# Patient Record
Sex: Female | Born: 1950 | Race: White | Hispanic: No | Smoking: Current every day smoker
Health system: Southern US, Community
[De-identification: ages and names within clinical notes are randomized; demographics above are authoritative.]

## PROBLEM LIST (undated history)

## (undated) DIAGNOSIS — Z9289 Personal history of other medical treatment: Secondary | ICD-10-CM

## (undated) DIAGNOSIS — R42 Dizziness and giddiness: Secondary | ICD-10-CM

## (undated) DIAGNOSIS — R0600 Dyspnea, unspecified: Secondary | ICD-10-CM

## (undated) DIAGNOSIS — N390 Urinary tract infection, site not specified: Secondary | ICD-10-CM

## (undated) DIAGNOSIS — R319 Hematuria, unspecified: Secondary | ICD-10-CM

## (undated) DIAGNOSIS — C801 Malignant (primary) neoplasm, unspecified: Secondary | ICD-10-CM

## (undated) DIAGNOSIS — F419 Anxiety disorder, unspecified: Secondary | ICD-10-CM

## (undated) HISTORY — PX: ABDOMINAL HYSTERECTOMY: SHX81

## (undated) HISTORY — PX: OTHER SURGICAL HISTORY: SHX169

---

## 1978-07-31 DIAGNOSIS — Z9289 Personal history of other medical treatment: Secondary | ICD-10-CM

## 1978-07-31 HISTORY — DX: Personal history of other medical treatment: Z92.89

## 1979-08-01 HISTORY — PX: TUBAL LIGATION: SHX77

## 2000-11-13 ENCOUNTER — Encounter: Payer: Self-pay | Admitting: Family Medicine

## 2000-11-13 ENCOUNTER — Encounter: Admission: RE | Admit: 2000-11-13 | Discharge: 2000-11-13 | Payer: Self-pay | Admitting: Family Medicine

## 2000-11-22 ENCOUNTER — Encounter: Payer: Self-pay | Admitting: Family Medicine

## 2000-11-22 ENCOUNTER — Encounter: Admission: RE | Admit: 2000-11-22 | Discharge: 2000-11-22 | Payer: Self-pay | Admitting: Family Medicine

## 2004-07-28 ENCOUNTER — Encounter: Admission: RE | Admit: 2004-07-28 | Discharge: 2004-07-28 | Payer: Self-pay | Admitting: Family Medicine

## 2007-12-11 ENCOUNTER — Other Ambulatory Visit: Admission: RE | Admit: 2007-12-11 | Discharge: 2007-12-11 | Payer: Self-pay | Admitting: Gynecology

## 2007-12-17 ENCOUNTER — Ambulatory Visit: Admission: RE | Admit: 2007-12-17 | Discharge: 2007-12-17 | Payer: Self-pay | Admitting: Gynecology

## 2007-12-24 ENCOUNTER — Encounter: Admission: RE | Admit: 2007-12-24 | Discharge: 2007-12-24 | Payer: Self-pay | Admitting: Gynecology

## 2007-12-25 ENCOUNTER — Ambulatory Visit (HOSPITAL_COMMUNITY): Admission: RE | Admit: 2007-12-25 | Discharge: 2007-12-25 | Payer: Self-pay | Admitting: Gynecology

## 2008-01-02 ENCOUNTER — Encounter (HOSPITAL_COMMUNITY): Admission: RE | Admit: 2008-01-02 | Discharge: 2008-03-13 | Payer: Self-pay | Admitting: Gynecology

## 2017-10-17 ENCOUNTER — Encounter (HOSPITAL_BASED_OUTPATIENT_CLINIC_OR_DEPARTMENT_OTHER): Payer: Self-pay | Admitting: *Deleted

## 2017-10-17 ENCOUNTER — Other Ambulatory Visit: Payer: Self-pay

## 2017-10-17 ENCOUNTER — Other Ambulatory Visit: Payer: Self-pay | Admitting: Urology

## 2017-10-17 NOTE — Progress Notes (Signed)
Spoke with Wiley Npo after midnight food, clear liquids until 800am arrive 1200 pm 10-26-17 [facility]  meds to take cipro Driver daughter Engineer, manufacturing systems over overnight instructions with patient  needs hemaglobin

## 2017-10-26 ENCOUNTER — Encounter (HOSPITAL_BASED_OUTPATIENT_CLINIC_OR_DEPARTMENT_OTHER): Payer: Self-pay | Admitting: *Deleted

## 2017-10-26 ENCOUNTER — Other Ambulatory Visit: Payer: Self-pay

## 2017-10-26 ENCOUNTER — Ambulatory Visit (HOSPITAL_BASED_OUTPATIENT_CLINIC_OR_DEPARTMENT_OTHER): Payer: BLUE CROSS/BLUE SHIELD | Admitting: Certified Registered Nurse Anesthetist

## 2017-10-26 ENCOUNTER — Encounter (HOSPITAL_BASED_OUTPATIENT_CLINIC_OR_DEPARTMENT_OTHER)
Admission: RE | Disposition: A | Discharge status: Home / Self Care | Payer: Self-pay | Source: Ambulatory Visit | Attending: Urology

## 2017-10-26 ENCOUNTER — Observation Stay (HOSPITAL_BASED_OUTPATIENT_CLINIC_OR_DEPARTMENT_OTHER)
Admission: RE | Admit: 2017-10-26 | Discharge: 2017-10-27 | Disposition: A | Discharge status: Home / Self Care | Payer: BLUE CROSS/BLUE SHIELD | Source: Ambulatory Visit | Attending: Urology | Admitting: Urology

## 2017-10-26 DIAGNOSIS — F17210 Nicotine dependence, cigarettes, uncomplicated: Secondary | ICD-10-CM | POA: Insufficient documentation

## 2017-10-26 DIAGNOSIS — Z79899 Other long term (current) drug therapy: Secondary | ICD-10-CM | POA: Insufficient documentation

## 2017-10-26 DIAGNOSIS — C672 Malignant neoplasm of lateral wall of bladder: Secondary | ICD-10-CM | POA: Diagnosis not present

## 2017-10-26 DIAGNOSIS — F419 Anxiety disorder, unspecified: Secondary | ICD-10-CM | POA: Insufficient documentation

## 2017-10-26 DIAGNOSIS — D494 Neoplasm of unspecified behavior of bladder: Secondary | ICD-10-CM | POA: Diagnosis present

## 2017-10-26 DIAGNOSIS — R310 Gross hematuria: Secondary | ICD-10-CM | POA: Diagnosis not present

## 2017-10-26 HISTORY — DX: Malignant (primary) neoplasm, unspecified: C80.1

## 2017-10-26 HISTORY — PX: TRANSURETHRAL RESECTION OF BLADDER TUMOR: SHX2575

## 2017-10-26 HISTORY — DX: Anxiety disorder, unspecified: F41.9

## 2017-10-26 HISTORY — DX: Urinary tract infection, site not specified: N39.0

## 2017-10-26 HISTORY — DX: Hematuria, unspecified: R31.9

## 2017-10-26 HISTORY — DX: Dizziness and giddiness: R42

## 2017-10-26 HISTORY — DX: Dyspnea, unspecified: R06.00

## 2017-10-26 HISTORY — DX: Personal history of other medical treatment: Z92.89

## 2017-10-26 LAB — POCT HEMOGLOBIN-HEMACUE: Hemoglobin: 12.6 g/dL (ref 12.0–15.0)

## 2017-10-26 SURGERY — TURBT (TRANSURETHRAL RESECTION OF BLADDER TUMOR)
Anesthesia: General | Site: Bladder

## 2017-10-26 MED ORDER — HYDROMORPHONE HCL 1 MG/ML IJ SOLN
0.2500 mg | Freq: Once | INTRAMUSCULAR | Status: AC
  Administered 2017-10-26: 0.25 mg via INTRAVENOUS
  Filled 2017-10-26: qty 0.5

## 2017-10-26 MED ORDER — HYDROMORPHONE HCL 1 MG/ML IJ SOLN
0.2500 mg | INTRAMUSCULAR | Status: DC | PRN
  Administered 2017-10-26 (×2): 0.25 mg via INTRAVENOUS
  Administered 2017-10-26: 0.5 mg via INTRAVENOUS
  Filled 2017-10-26: qty 0.5

## 2017-10-26 MED ORDER — FENTANYL CITRATE (PF) 100 MCG/2ML IJ SOLN
INTRAMUSCULAR | Status: DC | PRN
  Administered 2017-10-26 (×4): 25 ug via INTRAVENOUS

## 2017-10-26 MED ORDER — FENTANYL CITRATE (PF) 100 MCG/2ML IJ SOLN
INTRAMUSCULAR | Status: AC
  Filled 2017-10-26: qty 2

## 2017-10-26 MED ORDER — SODIUM CHLORIDE 0.9 % IR SOLN
3000.0000 mL | Status: DC
  Filled 2017-10-26: qty 3000

## 2017-10-26 MED ORDER — ACETAMINOPHEN 325 MG PO TABS
650.0000 mg | ORAL_TABLET | ORAL | Status: DC | PRN
  Filled 2017-10-26: qty 2

## 2017-10-26 MED ORDER — MIDAZOLAM HCL 5 MG/5ML IJ SOLN
INTRAMUSCULAR | Status: DC | PRN
  Administered 2017-10-26: 2 mg via INTRAVENOUS

## 2017-10-26 MED ORDER — HYDROCODONE-ACETAMINOPHEN 5-325 MG PO TABS
ORAL_TABLET | ORAL | Status: AC
  Filled 2017-10-26: qty 1

## 2017-10-26 MED ORDER — MIDAZOLAM HCL 2 MG/2ML IJ SOLN
INTRAMUSCULAR | Status: AC
  Filled 2017-10-26: qty 2

## 2017-10-26 MED ORDER — CEFAZOLIN SODIUM-DEXTROSE 2-4 GM/100ML-% IV SOLN
2.0000 g | Freq: Once | INTRAVENOUS | Status: AC
  Administered 2017-10-26: 2 g via INTRAVENOUS
  Filled 2017-10-26: qty 100

## 2017-10-26 MED ORDER — HYDROCODONE-ACETAMINOPHEN 5-325 MG PO TABS: 1.0000 | ORAL_TABLET | ORAL | 0 refills | Status: DC | PRN

## 2017-10-26 MED ORDER — PROPOFOL 10 MG/ML IV BOLUS
INTRAVENOUS | Status: AC
  Filled 2017-10-26: qty 20

## 2017-10-26 MED ORDER — SODIUM CHLORIDE 0.9 % IR SOLN
Status: DC | PRN
  Administered 2017-10-26: 21000 mL via INTRAVESICAL
  Administered 2017-10-26: 3000 mL via INTRAVESICAL

## 2017-10-26 MED ORDER — LIDOCAINE 2% (20 MG/ML) 5 ML SYRINGE
INTRAMUSCULAR | Status: DC | PRN
  Administered 2017-10-26: 60 mg via INTRAVENOUS

## 2017-10-26 MED ORDER — EPHEDRINE SULFATE-NACL 50-0.9 MG/10ML-% IV SOSY
PREFILLED_SYRINGE | INTRAVENOUS | Status: DC | PRN
  Administered 2017-10-26: 10 mg via INTRAVENOUS

## 2017-10-26 MED ORDER — LIDOCAINE 2% (20 MG/ML) 5 ML SYRINGE
INTRAMUSCULAR | Status: AC
  Filled 2017-10-26: qty 5

## 2017-10-26 MED ORDER — BISACODYL 10 MG RE SUPP
10.0000 mg | Freq: Every day | RECTAL | Status: DC | PRN
  Filled 2017-10-26: qty 1

## 2017-10-26 MED ORDER — HYDROMORPHONE HCL 1 MG/ML IJ SOLN
INTRAMUSCULAR | Status: AC
Start: 2017-10-26 — End: 2017-10-26
  Filled 2017-10-26: qty 1

## 2017-10-26 MED ORDER — DEXAMETHASONE SODIUM PHOSPHATE 10 MG/ML IJ SOLN
INTRAMUSCULAR | Status: AC
  Filled 2017-10-26: qty 1

## 2017-10-26 MED ORDER — ONDANSETRON HCL 4 MG/2ML IJ SOLN
INTRAMUSCULAR | Status: AC
  Filled 2017-10-26: qty 2

## 2017-10-26 MED ORDER — DEXAMETHASONE SODIUM PHOSPHATE 10 MG/ML IJ SOLN
INTRAMUSCULAR | Status: DC | PRN
  Administered 2017-10-26: 10 mg via INTRAVENOUS

## 2017-10-26 MED ORDER — SENNOSIDES-DOCUSATE SODIUM 8.6-50 MG PO TABS
1.0000 | ORAL_TABLET | Freq: Every evening | ORAL | Status: DC | PRN
  Filled 2017-10-26: qty 1

## 2017-10-26 MED ORDER — EPHEDRINE 5 MG/ML INJ
INTRAVENOUS | Status: AC
  Filled 2017-10-26: qty 10

## 2017-10-26 MED ORDER — SODIUM CHLORIDE 0.9 % IV SOLN
INTRAVENOUS | Status: DC
  Filled 2017-10-26: qty 1000

## 2017-10-26 MED ORDER — HYDROMORPHONE HCL 1 MG/ML IJ SOLN
0.2500 mg | INTRAMUSCULAR | Status: DC | PRN
  Filled 2017-10-26: qty 0.5

## 2017-10-26 MED ORDER — CEFAZOLIN SODIUM-DEXTROSE 2-4 GM/100ML-% IV SOLN
INTRAVENOUS | Status: AC
  Filled 2017-10-26: qty 100

## 2017-10-26 MED ORDER — ONDANSETRON HCL 4 MG/2ML IJ SOLN
INTRAMUSCULAR | Status: DC | PRN
  Administered 2017-10-26: 4 mg via INTRAVENOUS

## 2017-10-26 MED ORDER — PHENAZOPYRIDINE HCL 200 MG PO TABS: 200.0000 mg | ORAL_TABLET | Freq: Three times a day (TID) | ORAL | 0 refills | Status: AC | PRN

## 2017-10-26 MED ORDER — DIPHENHYDRAMINE HCL 12.5 MG/5ML PO ELIX
12.5000 mg | ORAL_SOLUTION | Freq: Four times a day (QID) | ORAL | Status: DC | PRN
  Filled 2017-10-26: qty 5

## 2017-10-26 MED ORDER — IOHEXOL 300 MG/ML  SOLN
INTRAMUSCULAR | Status: DC | PRN
  Administered 2017-10-26: 10 mL via URETHRAL

## 2017-10-26 MED ORDER — CEFAZOLIN SODIUM-DEXTROSE 1-4 GM/50ML-% IV SOLN
INTRAVENOUS | Status: AC
  Filled 2017-10-26: qty 50

## 2017-10-26 MED ORDER — BELLADONNA ALKALOIDS-OPIUM 16.2-60 MG RE SUPP
RECTAL | Status: DC | PRN
  Administered 2017-10-26: 1 via RECTAL

## 2017-10-26 MED ORDER — HYDROMORPHONE HCL 1 MG/ML IJ SOLN
INTRAMUSCULAR | Status: AC
  Filled 2017-10-26: qty 1

## 2017-10-26 MED ORDER — MORPHINE SULFATE (PF) 2 MG/ML IV SOLN
2.0000 mg | INTRAVENOUS | Status: DC | PRN
  Filled 2017-10-26: qty 2

## 2017-10-26 MED ORDER — LACTATED RINGERS IV SOLN
INTRAVENOUS | Status: DC
  Administered 2017-10-26 (×2): via INTRAVENOUS
  Filled 2017-10-26: qty 1000

## 2017-10-26 MED ORDER — HYDROCODONE-ACETAMINOPHEN 5-325 MG PO TABS
ORAL_TABLET | ORAL | Status: AC
  Filled 2017-10-26: qty 2

## 2017-10-26 MED ORDER — ONDANSETRON HCL 4 MG/2ML IJ SOLN
4.0000 mg | INTRAMUSCULAR | Status: DC | PRN
  Filled 2017-10-26: qty 2

## 2017-10-26 MED ORDER — BELLADONNA ALKALOIDS-OPIUM 16.2-60 MG RE SUPP
RECTAL | Status: AC
  Filled 2017-10-26: qty 1

## 2017-10-26 MED ORDER — BELLADONNA ALKALOIDS-OPIUM 16.2-60 MG RE SUPP
1.0000 | Freq: Four times a day (QID) | RECTAL | Status: DC | PRN
  Filled 2017-10-26: qty 1

## 2017-10-26 MED ORDER — PROPOFOL 10 MG/ML IV BOLUS
INTRAVENOUS | Status: DC | PRN
  Administered 2017-10-26: 200 mg via INTRAVENOUS

## 2017-10-26 MED ORDER — OXYBUTYNIN CHLORIDE 5 MG PO TABS
5.0000 mg | ORAL_TABLET | Freq: Three times a day (TID) | ORAL | Status: DC | PRN
  Filled 2017-10-26: qty 1

## 2017-10-26 MED ORDER — OXYBUTYNIN CHLORIDE ER 10 MG PO TB24: 10.0000 mg | ORAL_TABLET | Freq: Every day | ORAL | 1 refills | Status: AC

## 2017-10-26 MED ORDER — DIPHENHYDRAMINE HCL 50 MG/ML IJ SOLN
12.5000 mg | Freq: Four times a day (QID) | INTRAMUSCULAR | Status: DC | PRN
  Filled 2017-10-26: qty 0.25

## 2017-10-26 MED ORDER — HYDROCODONE-ACETAMINOPHEN 5-325 MG PO TABS
1.0000 | ORAL_TABLET | ORAL | Status: DC | PRN
  Administered 2017-10-26: 1 via ORAL
  Administered 2017-10-26: 2 via ORAL
  Administered 2017-10-27: 1 via ORAL
  Filled 2017-10-26: qty 2

## 2017-10-26 MED ORDER — ONDANSETRON HCL 4 MG PO TABS: 4.0000 mg | ORAL_TABLET | Freq: Every day | ORAL | 1 refills | Status: AC | PRN

## 2017-10-26 MED ORDER — CEFAZOLIN SODIUM-DEXTROSE 1-4 GM/50ML-% IV SOLN
1.0000 g | Freq: Three times a day (TID) | INTRAVENOUS | Status: DC
  Administered 2017-10-26 – 2017-10-27 (×2): 1 g via INTRAVENOUS
  Filled 2017-10-26: qty 50

## 2017-10-26 SURGICAL SUPPLY — 22 items
BAG DRAIN URO-CYSTO SKYTR STRL (DRAIN) ×4 IMPLANT
BAG DRN UROCATH (DRAIN) ×1
BAG URINE DRAINAGE (UROLOGICAL SUPPLIES) IMPLANT
BAG URINE LEG 500ML (DRAIN) ×2 IMPLANT
CATH HEMA 3WAY 30CC 22FR COUDE (CATHETERS) ×2 IMPLANT
CATH URET 5FR 28IN OPEN ENDED (CATHETERS) ×2 IMPLANT
GLOVE BIO SURGEON STRL SZ7.5 (GLOVE) ×3 IMPLANT
GOWN STRL REUS W/ TWL XL LVL3 (GOWN DISPOSABLE) ×1 IMPLANT
GOWN STRL REUS W/TWL XL LVL3 (GOWN DISPOSABLE) ×3
GUIDEWIRE ANG ZIPWIRE 038X150 (WIRE) ×2 IMPLANT
GUIDEWIRE ZIPWRE .038 STRAIGHT (WIRE) ×2 IMPLANT
HOLDER FOLEY CATH W/STRAP (MISCELLANEOUS) ×2 IMPLANT
IV NS IRRIG 3000ML ARTHROMATIC (IV SOLUTION) ×16 IMPLANT
LOOP CUT BIPOLAR 24F LRG (ELECTROSURGICAL) ×2 IMPLANT
MANIFOLD NEPTUNE II (INSTRUMENTS) ×3 IMPLANT
NS IRRIG 500ML POUR BTL (IV SOLUTION) IMPLANT
PACK CYSTO (CUSTOM PROCEDURE TRAY) ×3 IMPLANT
SYRINGE IRR TOOMEY STRL 70CC (SYRINGE) IMPLANT
TUBE CONNECTING 12'X1/4 (SUCTIONS) ×1
TUBE CONNECTING 12X1/4 (SUCTIONS) ×2 IMPLANT
WATER STERILE IRR 3000ML UROMA (IV SOLUTION) IMPLANT
WATER STERILE IRR 500ML POUR (IV SOLUTION) IMPLANT

## 2017-10-26 NOTE — Op Note (Signed)
Operative Note  Preoperative diagnosis:  1.  5 cm bladder tumor involving the right lateral wall the bladder  Postoperative diagnosis: 1.  5 cm bladder tumor involving the right lateral wall the bladder immediately adjacent to the right ureteral orifice, but not directly involved.  Procedure(s): 1.  Cystoscopy with TURBT of 5 cm bladder tumor 2.  Right retrograde pyelogram  Surgeon: Ellison Hughs, MD  Assistants: None  Anesthesia: General LMA  Complications: None  EBL: 100 mL  Specimens: 1.  Superficial and deep bladder tumor margins  Drains/Catheters: 1.  20 French three-way Foley catheter with 10 mL in the balloon  Intraoperative findings:   1. 5 cm bladder tumor immediately adjacent to the right ureteral orifice, but not directly involved with it. 2. Solitary right collecting system with no filling defects or dilation involving the right ureter or right renal pelvis seen on retrograde pyelogram   Indication:  Alexis Mcclain is a 67 y.o. female with a history of gross hematuria. She had a CT Urogram on 10/12/17, which showed a 5 cm bladder lesion involving the right lateral wall of the bladder, concerning for malignancy.  She had a cystoscopy in the office that revealed a large papillary bladder mass concerning for malignancy.  She is here today for TURBT to address the bladder mass.   Description of procedure:  After informed consent was obtained, the patient was brought to the operating room and general LMA anesthesia was administered. The patient was then placed in the dorsolithotomy position and prepped and draped in usual sterile fashion. A timeout was performed. A 26 French resectoscope was then advanced into the urethral orifice and into the bladder.  A complete bladder survey revealed the known 5 cm papillary bladder tumor seen on office cystoscopy.  The right ureteral orifice was identified immediately bordering the inferior aspects bladder tumor, but not  directly involved.  A 5 French open-ended catheter was then inserted into the right ureteral orifice and a right retrograde pyelogram was obtained, with the findings listed above.  The large bladder tumor was then systematically resected, starting with the superficial tumor.  The superficial tumor was then hand irrigated out of the bladder through the sheath of the cystoscope and sent off as a separate specimen.  The tumor base was then extensively resected down to the detrusor musculature.  The remaining bladder tumor was then irrigated through the sheath of the cystoscope and sent off as a separate specimen.  The tumor resection site was then extensively fulgurated until hemostasis was achieved.  No direct electrocautery or coagulation was used adjacent to the right ureteral orifice,, which was identifiable at the end of the case.  A 20 French three-way Foley catheter was then inserted and started on continuous bladder irrigation.  The patient tolerated the procedure well and was transferred to the postanesthesia unit in stable condition.  Plan: Monitor the patient overnight with continuous bladder irrigation.  Home tomorrow with Foley catheter.  Follow-up in 7-10 days to discuss her pathology results and for a voiding trial.

## 2017-10-26 NOTE — Transfer of Care (Signed)
Immediate Anesthesia Transfer of Care Note  Patient: [name]  Procedure(s) Performed: TRANSURETHRAL RESECTION OF BLADDER TUMOR / CYSTOSCOPY(TURBT) (N/A Bladder)  Patient Location: Immediate Anesthesia Transfer of Care Note  Patient: [name]  Procedure(s) Performed: Procedure(s) (LRB): TRANSURETHRAL RESECTION OF BLADDER TUMOR / CYSTOSCOPY(TURBT) (N/A)  Patient Location: PACU  Anesthesia Type: General  Level of Consciousness: awake, alert  and oriented  Airway & Oxygen Therapy: Patient Spontanous Breathing and Patient connected to face mask oxygen  Post-op Assessment: Report given to PACU RN and Post -op Vital signs reviewed and stable  Post vital signs: Reviewed and stable  Complications: No apparent anesthesia complications Last Vitals:  Vitals Value Taken Time  BP 146/71 10/26/2017  3:45 PM  Temp 36.9 C 10/26/2017  3:40 PM  Pulse 86 10/26/2017  3:47 PM  Resp 13 10/26/2017  3:47 PM  SpO2 99 % 10/26/2017  3:47 PM  Vitals shown include unvalidated device data.  Last Pain:  Vitals:   10/26/17 1235  TempSrc:   PainSc: 2       Patients Stated Pain Goal: 4 (10/26/17 1235)

## 2017-10-26 NOTE — Interval H&P Note (Signed)
History and Physical Interval Note:  10/26/2017 12:44 PM  Alexis Mcclain  has presented today for surgery, with the diagnosis of BLADDER TUMOR  The various methods of treatment have been discussed with the patient and family. After consideration of risks, benefits and other options for treatment, the patient has consented to  Procedure(s) with comments: TRANSURETHRAL RESECTION OF BLADDER TUMOR / CYSTOSCOPY(TURBT) (N/A) - ONLY NEEDS 90 MIN as a surgical intervention .  The patient's history has been reviewed, patient examined, no change in status, stable for surgery.  I have reviewed the patient's chart and labs.  Questions were answered to the patient's satisfaction.     Conception Oms [name]

## 2017-10-26 NOTE — Anesthesia Procedure Notes (Signed)
Procedure Name: LMA Insertion Date/Time: 10/26/2017 2:05 PM Performed by: Genelle Bal, CRNA Pre-anesthesia Checklist: Patient identified, Emergency Drugs available, Suction available and Patient being monitored Patient Re-evaluated:Patient Re-evaluated prior to induction Oxygen Delivery Method: Circle system utilized Preoxygenation: Pre-oxygenation with 100% oxygen Induction Type: IV induction Ventilation: Mask ventilation without difficulty LMA: LMA inserted LMA Size: 4.0 Number of attempts: 1 Airway Equipment and Method: Bite block Placement Confirmation: positive ETCO2 Tube secured with: Tape Dental Injury: Teeth and Oropharynx as per pre-operative assessment

## 2017-10-26 NOTE — Anesthesia Preprocedure Evaluation (Signed)
Anesthesia Evaluation  Patient identified by MRN, date of birth, ID band Patient awake    Reviewed: Allergy & Precautions, NPO status , Patient's Chart, lab work & pertinent test results  Airway Mallampati: II  TM Distance: >3 FB     Dental   Pulmonary shortness of breath, Current Smoker,    breath sounds clear to auscultation       Cardiovascular negative cardio ROS   Rhythm:Regular Rate:Normal     Neuro/Psych    GI/Hepatic negative GI ROS, Neg liver ROS,   Endo/Other  negative endocrine ROS  Renal/GU negative Renal ROS     Musculoskeletal   Abdominal   Peds  Hematology   Anesthesia Other Findings   Reproductive/Obstetrics                             Anesthesia Physical Anesthesia Plan  ASA: II  Anesthesia Plan: General   Post-op Pain Management:    Induction: Intravenous  PONV Risk Score and Plan: 2 and Treatment may vary due to age or medical condition, Ondansetron, Dexamethasone and Midazolam  Airway Management Planned:   Additional Equipment:   Intra-op Plan:   Post-operative Plan: Extubation in OR  Informed Consent: I have reviewed the patients History and Physical, chart, labs and discussed the procedure including the risks, benefits and alternatives for the proposed anesthesia with the patient or authorized representative who has indicated his/her understanding and acceptance.   Dental advisory given  Plan Discussed with: CRNA and Anesthesiologist  Anesthesia Plan Comments:         Anesthesia Quick Evaluation

## 2017-10-26 NOTE — H&P (Signed)
Urology Preoperative H&P   Chief Complaint: bladder mass  History of Present Illness: Alexis Mcclain is a 67 y.o. female with a history of gross hematuria. She had a CT Urogram on 10/12/17, which showed a 5 cm bladder lesion involving the right lateral wall of the bladder, concerning for malignancy.  She had a cystoscopy in the office that revealed a large papillary bladder mass concerning for malignancy.  She is here today for TURBT to address the bladder mass.  CT UROGRAM (10/12/17)  IMPRESSION:  1. 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 No other filling defects are identified along the urothelium. 2. No findings of pathologic adenopathy in the abdomen or pelvis.3. Mild sclerosis of the pubic bones is probably degenerative. Stable right adnexal cystic lesion from 2009, considered benign. 4. Air fluid levels in the distal colon indicating a diarrheal process. 5. Small left adrenal adenoma.    Past Medical History:  Diagnosis Date  . Anxiety   . Cancer ([place])    bladder   . Dizzy    occ last week or so  . Dyspnea    with exertion  . Hematuria last 3 months  . History of blood transfusion 1980   after surgery for ovarian cyst rupture lost pregnancy  . UTI (urinary tract infection)    on ciprofloxacin    Past Surgical History:  Procedure Laterality Date  . ABDOMINAL HYSTERECTOMY  1990's   partial 1 ovary left  . colonscopy  yrs ago  . surgery for ovarian cyst   1990's  . TUBAL LIGATION  1981   laparoscopic    Allergies: No Known Allergies  History reviewed. No pertinent family history.  Social History:  reports that she has been smoking cigarettes.  She has a 63.00 pack-year smoking history. She has never used smokeless tobacco. She reports that she does not drink alcohol or use drugs.  ROS: A complete review of systems was performed.  All systems are negative except for pertinent findings as noted.  Physical Exam:   Vital signs in last 24 hours:   Constitutional:  Alert and oriented, No acute distress Cardiovascular: Regular rate and rhythm, No JVD Respiratory: Normal respiratory effort, Lungs clear bilaterally GI: Abdomen is soft, nontender, nondistended, no abdominal masses GU: No CVA tenderness Lymphatic: No lymphadenopathy Neurologic: Grossly intact, no focal deficits Psychiatric: Normal mood and affect  Laboratory Data:  No results for input(s): WBC, HGB, HCT, PLT in the last 72 hours.  No results for input(s): NA, K, CL, GLUCOSE, BUN, CALCIUM, CREATININE in the last 72 hours.  Invalid input(s): CO3   No results found for this or any previous visit (from the past 24 hour(s)). No results found for this or any previous visit (from the past 240 hour(s)).  Renal Function: No results for input(s): CREATININE in the last 168 hours. CrCl cannot be calculated (No order found.).  Radiologic Imaging: No results found.  I independently reviewed the above imaging studies.  Assessment and Plan Alexis Mcclain is a 67 y.o. female with a 5 cm bladder mass concerning for malignancy  -The risks, benefits and alternatives of cystoscopy with TURBT and possible right ureteral stent placement was discussed with the patient. The risks included, but are not limited to: bleeding, urinary tract infection, bladder perforation requiring prolonged catheterization and/or open bladder repair, ureteral obstruction, stent related discomfort, voiding dysfunction and the inherent risks of general anesthesia. The patient voices understanding and wishes to  proceed.   Ellison Hughs, MD 10/26/2017, 11:21 AM  [facility] Pager: [phone]

## 2017-10-27 DIAGNOSIS — C672 Malignant neoplasm of lateral wall of bladder: Secondary | ICD-10-CM | POA: Diagnosis not present

## 2017-10-27 LAB — HEMOGLOBIN AND HEMATOCRIT, BLOOD
HEMATOCRIT: 30 % — AB (ref 36.0–46.0)
Hemoglobin: 9.6 g/dL — ABNORMAL LOW (ref 12.0–15.0)

## 2017-10-27 LAB — HIV ANTIBODY (ROUTINE TESTING W REFLEX): HIV SCREEN 4TH GENERATION: NONREACTIVE

## 2017-10-27 MED ORDER — CEFAZOLIN SODIUM-DEXTROSE 1-4 GM/50ML-% IV SOLN
INTRAVENOUS | Status: AC
  Filled 2017-10-27: qty 50

## 2017-10-27 MED ORDER — HYDROCODONE-ACETAMINOPHEN 5-325 MG PO TABS
ORAL_TABLET | ORAL | Status: AC
  Filled 2017-10-27: qty 1

## 2017-10-27 NOTE — Discharge Summary (Signed)
Physician Discharge Summary  Patient ID: Alexis Mcclain MRN: [id number] DOB/AGE: 65-Jan-1952 65 y.o.  Admit date: 10/26/2017 Discharge date: 10/27/2017  Admission Diagnoses: Bladder tumor  Discharge Diagnoses:  Active Problems:   Bladder tumor   Discharged Condition: good  Hospital Course: She was noted to have a large bladder tumor and was admitted for elective transurethral resection.  This proceeded without difficulty.  She was observed overnight on continuous bladder irrigation with her urine having remained completely clear all night long.  She is not having any pain.  She is tolerating regular diet.  She is felt ready for discharge at this time.   Discharge Exam: Blood pressure (!) 104/55, pulse (!) 56, temperature 98.6 F (37 C), temperature source Oral, resp. rate 16, height 5\' 2"  (1.575 m), weight 59.6 kg (131 lb 4.8 oz), SpO2 95 %. Awake, alert and in no distress. Normal respiratory effort and rate. Cardiovascular rate rate and rhythm Abdomen soft and nontender Foley catheter draining completely clear urine.  Disposition: Discharge disposition: 01-Home or Self Care       Discharge Instructions    Discharge patient   Complete by:  As directed    Discharge disposition:  01-Home or Self Care   Discharge patient date:  10/27/2017      thank you Follow-up Information    Ceasar Mons, MD In 1 week.   Specialty:  Urology Why:  catheter remvoal and to discuss pathology results Contact information: [address] [place] [address] [phone]           Signed: Claybon Jabs 10/27/2017, 7:58 AM

## 2017-10-29 ENCOUNTER — Encounter (HOSPITAL_BASED_OUTPATIENT_CLINIC_OR_DEPARTMENT_OTHER): Payer: Self-pay | Admitting: Urology

## 2017-10-30 NOTE — Anesthesia Postprocedure Evaluation (Signed)
Anesthesia Post Note  Patient: Alexis Mcclain  Procedure(s) Performed: TRANSURETHRAL RESECTION OF BLADDER TUMOR / CYSTOSCOPY(TURBT) (N/A Bladder)     Patient location during evaluation: PACU Anesthesia Type: General Level of consciousness: awake Pain management: pain level controlled Vital Signs Assessment: post-procedure vital signs reviewed and stable Respiratory status: spontaneous breathing Cardiovascular status: stable Anesthetic complications: no    Last Vitals:  Vitals:   10/27/17 0300 10/27/17 0700  BP: 118/60 (!) 104/55  Pulse: 62 (!) 56  Resp: 16 16  Temp: 37 C 37 C  SpO2: 96% 95%    Last Pain:  Vitals:   10/29/17 1019  TempSrc:   PainSc: 2                  Kaheem Halleck

## 2017-11-02 ENCOUNTER — Ambulatory Visit (HOSPITAL_COMMUNITY)
Admission: RE | Admit: 2017-11-02 | Discharge: 2017-11-02 | Disposition: A | Discharge status: Home / Self Care | Payer: BLUE CROSS/BLUE SHIELD | Source: Ambulatory Visit | Attending: Urology | Admitting: Urology

## 2017-11-02 ENCOUNTER — Other Ambulatory Visit (HOSPITAL_COMMUNITY): Payer: Self-pay | Admitting: Urology

## 2017-11-02 ENCOUNTER — Telehealth: Payer: Self-pay | Admitting: Oncology

## 2017-11-02 DIAGNOSIS — C672 Malignant neoplasm of lateral wall of bladder: Secondary | ICD-10-CM | POA: Diagnosis not present

## 2017-11-02 NOTE — Telephone Encounter (Signed)
Called referring office and patient with date/time/location/phone # for appt

## 2017-11-14 ENCOUNTER — Encounter: Payer: Self-pay | Admitting: *Deleted

## 2017-11-14 NOTE — Progress Notes (Signed)
Alexis Mcclain  Clinical Social Mcclain received phone call from patient's daughter requesting information on applying for disability and possible insurance options.  CSW discussed social security disability process and made referral to [facility] to assist in application.  Patient interested in applying for Medicaid if she loses insurance through her employer.    [name], MSW, LCSW, OSW-C Clinical Social Worker [facility] [phone]

## 2017-11-21 ENCOUNTER — Inpatient Hospital Stay: Payer: BLUE CROSS/BLUE SHIELD | Attending: Oncology | Admitting: Oncology

## 2017-11-21 ENCOUNTER — Encounter: Payer: Self-pay | Admitting: Oncology

## 2017-11-21 ENCOUNTER — Inpatient Hospital Stay: Payer: BLUE CROSS/BLUE SHIELD

## 2017-11-21 VITALS — BP 151/72 | HR 90 | Temp 97.9°F | Resp 17 | Ht 62.0 in | Wt 132.7 lb

## 2017-11-21 DIAGNOSIS — C679 Malignant neoplasm of bladder, unspecified: Secondary | ICD-10-CM | POA: Diagnosis not present

## 2017-11-21 DIAGNOSIS — C7989 Secondary malignant neoplasm of other specified sites: Secondary | ICD-10-CM

## 2017-11-21 DIAGNOSIS — D494 Neoplasm of unspecified behavior of bladder: Secondary | ICD-10-CM

## 2017-11-21 DIAGNOSIS — R319 Hematuria, unspecified: Secondary | ICD-10-CM | POA: Diagnosis not present

## 2017-11-21 DIAGNOSIS — D649 Anemia, unspecified: Secondary | ICD-10-CM | POA: Diagnosis not present

## 2017-11-21 DIAGNOSIS — F17210 Nicotine dependence, cigarettes, uncomplicated: Secondary | ICD-10-CM

## 2017-11-21 MED ORDER — LIDOCAINE-PRILOCAINE 2.5-2.5 % EX CREA: 1.0000 "application " | TOPICAL_CREAM | CUTANEOUS | 0 refills | Status: DC | PRN

## 2017-11-21 MED ORDER — PROCHLORPERAZINE MALEATE 10 MG PO TABS: 10.0000 mg | ORAL_TABLET | Freq: Four times a day (QID) | ORAL | 0 refills | Status: DC | PRN

## 2017-11-21 NOTE — Progress Notes (Signed)
Reason for Referral: Bladder cancer  HPI: 67 year old Alexis Mcclain currently of [place] diagnosed with bladder cancer in March 2019.  She presented with symptoms of hematuria and was evaluated by Dr. Lovena Neighbours at [facility].  She had a CT urogram on 10/12/2017 which showed a 5 cm bladder mass involving the right lateral wall concerning for malignancy.  No lymphadenopathy noted on that imaging studies.  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  She was referred to me for the evaluation for neoadjuvant chemotherapy prior to potential cystectomy.  Her baseline kidney function is normal.  Since her TURBT she has been reasonably asymptomatic.  She denies any hematuria or dysuria.  She denies any flank pain or discomfort.  She does report some mild fatigue and occasional dyspnea on exertion.  She continues to smoke currently about 1 pack a day but has smoked heavily for many years close to 2 pack a day.  She does not report any headaches, blurry vision, syncope or seizures. Does not report any fevers, chills or sweats.  Does not report any cough, wheezing or hemoptysis.  Does not report any chest pain, palpitation, orthopnea or leg edema.  Does not report any nausea, vomiting or abdominal pain.  Does not report any constipation or diarrhea.  Does not report any skeletal complaints.    Does not report frequency, urgency or hematuria.  Does not report any skin rashes or lesions. Does not report any heat or cold intolerance.  Does not report any lymphadenopathy or petechiae.  Does not report any anxiety or depression.  Remaining review of systems is negative.    Past Medical History:  Diagnosis Date  . Anxiety   . Cancer ([place])    bladder   . Dizzy    occ last week or so  . Dyspnea    with exertion  . Hematuria  last 3 months  . History of blood transfusion 1980   after surgery for ovarian cyst rupture lost pregnancy  . UTI (urinary tract infection)    on ciprofloxacin  :  Past Surgical History:  Procedure Laterality Date  . ABDOMINAL HYSTERECTOMY  1990's   partial 1 ovary left  . colonscopy  yrs ago  . surgery for ovarian cyst   1990's  . TRANSURETHRAL RESECTION OF BLADDER TUMOR N/A 10/26/2017   Procedure: TRANSURETHRAL RESECTION OF BLADDER TUMOR / CYSTOSCOPY(TURBT);  Surgeon: Ceasar Mons, MD;  Location: [facility];  Service: Urology;  Laterality: N/A;  ONLY NEEDS 90 MIN  . TUBAL LIGATION  1981   laparoscopic  :   Current Outpatient Medications:  .  HYDROcodone-acetaminophen (NORCO) 5-325 MG tablet, Take 1 tablet by mouth every 4 (four) hours as needed for moderate pain., Disp: 20 tablet, Rfl: 0 .  ondansetron (ZOFRAN) 4 MG tablet, Take 1 tablet (4 mg total) by mouth daily as needed for nausea or vomiting., Disp: 30 tablet, Rfl: 1 .  oxybutynin (DITROPAN XL) 10 MG 24 hr tablet, Take 1 tablet (10 mg total) by mouth daily., Disp: 30 tablet, Rfl: 1 .  phenazopyridine (PYRIDIUM) 200 MG tablet, Take 1 tablet (200 mg total) by mouth 3 (three) times daily as needed for pain., Disp: 30 tablet, Rfl: 0:  Allergies  Allergen Reactions  . Tape Other (See Comments)    Burns skin    Hurt when pulled off  :  No  family history on file.:  Social History   Socioeconomic History  . Marital status: Divorced    Spouse name: Not on file  . Number of children: Not on file  . Years of education: Not on file  . Highest education level: Not on file  Occupational History  . Not on file  Social Needs  . Financial resource strain: Not on file  . Food insecurity:    Worry: Not on file    Inability: Not on file  . Transportation needs:    Medical: Not on file    Non-medical: Not on file  Tobacco Use  . Smoking status: Current Every Day Smoker    Packs/day: 1.50    Years:  42.00    Pack years: 63.00    Types: Cigarettes  . Smokeless tobacco: Never Used  Substance and Sexual Activity  . Alcohol use: No    Frequency: Never  . Drug use: No  . Sexual activity: Not on file  Lifestyle  . Physical activity:    Days per week: Not on file    Minutes per session: Not on file  . Stress: Not on file  Relationships  . Social connections:    Talks on phone: Not on file    Gets together: Not on file    Attends religious service: Not on file    Active member of club or organization: Not on file    Attends meetings of clubs or organizations: Not on file    Relationship status: Not on file  . Intimate partner violence:    Fear of current or ex partner: Not on file    Emotionally abused: Not on file    Physically abused: Not on file    Forced sexual activity: Not on file  Other Topics Concern  . Not on file  Social History Narrative  . Not on file  :    Exam: Blood pressure (!) 151/72, pulse 90, temperature 97.9 F (36.6 C), temperature source Oral, resp. rate 17, height 5\' 2"  (1.575 m), weight 132 lb 11.2 oz (60.2 kg), SpO2 98 %.   ECOG 1 General appearance: alert and cooperative appeared without distress. Head: atraumatic without any abnormalities. Eyes: conjunctivae/corneas clear. PERRL.  Sclera anicteric. Throat: lips, mucosa, and tongue normal; without oral thrush or ulcers. Resp: clear to auscultation bilaterally without rhonchi, wheezes or dullness to percussion. Cardio: regular rate and rhythm, S1, S2 normal, no murmur, click, rub or gallop GI: soft, non-tender; bowel sounds normal; no masses,  no organomegaly Skin: Skin color, texture, turgor normal. No rashes or lesions Lymph nodes: Cervical, supraclavicular, and axillary nodes normal. Neurologic: Grossly normal without any motor, sensory or deep tendon reflexes. Musculoskeletal: No joint deformity or effusion.  CBC    Component Value Date/Time   HGB 9.6 (L) 10/27/2017 0548   HCT 30.0  (L) 10/27/2017 0548     Dg Chest 2 View  Result Date: 11/02/2017 CLINICAL DATA:  Malignant neoplasm bladder wall urinary bladder. EXAM: CHEST - 2 VIEW COMPARISON:  12/17/2007 FINDINGS: Pulmonary hyperinflation. Lungs are clear without infiltrate effusion or mass. No interval change. IMPRESSION: No active cardiopulmonary disease. Electronically Signed   By: Franchot Gallo M.D.   On: 11/02/2017 16:58    Assessment and Plan:   67 year old Alexis Mcclain with the following issues:  1.  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  Her tumor invades into the muscularis propria indicating potentially T3 disease.  Her abdominal  imaging studies did not show any lymphadenopathy.  She presented initially with hematuria.  The natural course of this disease was discussed today and treatment options were reviewed.  These options would include radical cystectomy alone followed by adjuvant chemotherapy, neoadjuvant chemotherapy followed by radical cystectomy versus definitive treatment with radiation concomitantly with chemotherapy.  After discussion today, she is agreeable to proceed with neoadjuvant chemotherapy after obtaining a PET scan for staging purposes.  Complication associated with chemotherapy include nausea, vomiting, myelosuppression, neutropenia, neutropenic sepsis, renal dysfunction as well as infusion related complications.  The plan is tentatively to proceed with 4 cycles of chemotherapy utilizing cisplatin and gemcitabine given on day 1, gemcitabine on day 8 out of 21-day cycle.  2.  Renal function surveillance: Her creatinine at baseline is normal without any evidence of hydronephrosis.  3.  Anemia: Her hemoglobin is 9.6 likely related to hematuria.  We will continue to monitor her hemoglobin and transfuse as needed.  4.  IV access: Risks and benefits of the Port-A-Cath insertion was reviewed today and she is agreeable to proceed.   Complication associated with this procedure include bleeding, thrombosis and infection.  EMLA cream was also prescribed at this time.  5.  Antiemetics: Prescription for Compazine was made available to her.  6.  Dyspnea on exertion: I recommend a cardiology evaluation prior to future surgery potentially.  7.  Prognosis: Her disease remains incurable as long as she does not have metastatic disease detected on PET scan.  However, she does have an aggressive histology and aggressive treatment is warranted to ensure disease stability.  8.  Follow-up: Will be in the next 2 weeks to start chemotherapy tentatively.    60  minutes was spent with the patient face-to-face today.  More than 50% of time was dedicated to patient counseling, education and coordination of her multifaceted care.

## 2017-11-21 NOTE — Progress Notes (Signed)
START ON PATHWAY REGIMEN - Bladder     A cycle is every 21 days:     Gemcitabine      Cisplatin   **Always confirm dose/schedule in your pharmacy ordering system**    Patient Characteristics: Pre Cystectomy, Clinical T2-T4a, N0-1, M0, Cystectomy Eligible, Cisplatin-Based Chemotherapy Indicated (CrCl ? 50 mL/min and Minimal or No Symptoms) AJCC M Category: M0 AJCC N Category: N0 AJCC T Category: T3 Current evidence of distant metastases<= No AJCC 8 Stage Grouping: IIIA Intent of Therapy: Curative Intent, Discussed with Patient 

## 2017-11-21 NOTE — Addendum Note (Signed)
Addended by: Wyatt Portela on: 11/21/2017 02:24 PM   Modules accepted: Orders

## 2017-11-21 NOTE — Progress Notes (Signed)
Met with patient and daughters to discuss financial assistance.  Advised there are currently no foundations available for her diagnosis, but should anything come available, I will reach out to her. Provided a flyer on [employer], should she become interested in the future where she can combine accounts and make one monthly payment.  Proof of income was provided. Patient approved for one-time $400 [employer]. Gave patient a copy of approval letter as well as expense sheet along with the outpatient pharmacy information.  She has my card for any additional financial questions or concerns.

## 2017-11-23 ENCOUNTER — Other Ambulatory Visit: Payer: BLUE CROSS/BLUE SHIELD

## 2017-11-28 ENCOUNTER — Telehealth: Payer: Self-pay | Admitting: *Deleted

## 2017-11-28 ENCOUNTER — Encounter (HOSPITAL_COMMUNITY)
Admission: RE | Admit: 2017-11-28 | Discharge: 2017-11-28 | Disposition: A | Discharge status: Home / Self Care | Payer: BLUE CROSS/BLUE SHIELD | Source: Ambulatory Visit | Attending: Oncology | Admitting: Oncology

## 2017-11-28 DIAGNOSIS — D494 Neoplasm of unspecified behavior of bladder: Secondary | ICD-10-CM

## 2017-11-28 LAB — GLUCOSE, CAPILLARY: Glucose-Capillary: 106 mg/dL — ABNORMAL HIGH (ref 65–99)

## 2017-11-28 MED ORDER — FLUDEOXYGLUCOSE F - 18 (FDG) INJECTION
7.3300 | Freq: Once | INTRAVENOUS | Status: AC | PRN
  Administered 2017-11-28: 7.33 via INTRAVENOUS

## 2017-11-28 NOTE — Telephone Encounter (Signed)
As noted below by Dr. Alen Blew, I informed patient that her scan showed no cancer outside the bladder. She verbalized understanding.

## 2017-11-28 NOTE — Telephone Encounter (Signed)
-----   Message from Alexis Portela, MD sent at 11/28/2017 11:56 AM EDT ----- Please let her know her PET scan showed no cancer outside the bladder.

## 2017-11-29 ENCOUNTER — Telehealth: Payer: Self-pay | Admitting: Oncology

## 2017-11-29 NOTE — Telephone Encounter (Signed)
FAXED RECORDS TO [facility]  RELEASE ID [id number]

## 2017-12-06 ENCOUNTER — Other Ambulatory Visit: Payer: Self-pay | Admitting: Radiology

## 2017-12-10 ENCOUNTER — Ambulatory Visit (HOSPITAL_COMMUNITY)
Admission: RE | Admit: 2017-12-10 | Discharge: 2017-12-10 | Disposition: A | Discharge status: Home / Self Care | Payer: BLUE CROSS/BLUE SHIELD | Source: Ambulatory Visit | Attending: Oncology | Admitting: Oncology

## 2017-12-10 ENCOUNTER — Other Ambulatory Visit: Payer: Self-pay | Admitting: Oncology

## 2017-12-10 ENCOUNTER — Encounter (HOSPITAL_COMMUNITY): Payer: Self-pay | Admitting: Interventional Radiology

## 2017-12-10 DIAGNOSIS — F17210 Nicotine dependence, cigarettes, uncomplicated: Secondary | ICD-10-CM | POA: Diagnosis not present

## 2017-12-10 DIAGNOSIS — Z90711 Acquired absence of uterus with remaining cervical stump: Secondary | ICD-10-CM | POA: Diagnosis not present

## 2017-12-10 DIAGNOSIS — Z9889 Other specified postprocedural states: Secondary | ICD-10-CM | POA: Insufficient documentation

## 2017-12-10 DIAGNOSIS — Z87440 Personal history of urinary (tract) infections: Secondary | ICD-10-CM | POA: Diagnosis not present

## 2017-12-10 DIAGNOSIS — C679 Malignant neoplasm of bladder, unspecified: Secondary | ICD-10-CM | POA: Diagnosis not present

## 2017-12-10 DIAGNOSIS — D494 Neoplasm of unspecified behavior of bladder: Secondary | ICD-10-CM

## 2017-12-10 DIAGNOSIS — Z888 Allergy status to other drugs, medicaments and biological substances status: Secondary | ICD-10-CM | POA: Diagnosis not present

## 2017-12-10 HISTORY — PX: IR US GUIDE VASC ACCESS RIGHT: IMG2390

## 2017-12-10 HISTORY — PX: IR FLUORO GUIDE PORT INSERTION RIGHT: IMG5741

## 2017-12-10 LAB — CBC WITH DIFFERENTIAL/PLATELET
BASOS ABS: 0 10*3/uL (ref 0.0–0.1)
Basophils Relative: 0 %
Eosinophils Absolute: 0.3 10*3/uL (ref 0.0–0.7)
Eosinophils Relative: 3 %
HEMATOCRIT: 40.1 % (ref 36.0–46.0)
HEMOGLOBIN: 12.6 g/dL (ref 12.0–15.0)
Lymphocytes Relative: 37 %
Lymphs Abs: 3.1 10*3/uL (ref 0.7–4.0)
MCH: 27.5 pg (ref 26.0–34.0)
MCHC: 31.4 g/dL (ref 30.0–36.0)
MCV: 87.4 fL (ref 78.0–100.0)
MONO ABS: 0.8 10*3/uL (ref 0.1–1.0)
MONOS PCT: 10 %
NEUTROS ABS: 4.1 10*3/uL (ref 1.7–7.7)
Neutrophils Relative %: 50 %
Platelets: 463 10*3/uL — ABNORMAL HIGH (ref 150–400)
RBC: 4.59 MIL/uL (ref 3.87–5.11)
RDW: 14.2 % (ref 11.5–15.5)
WBC: 8.2 10*3/uL (ref 4.0–10.5)

## 2017-12-10 LAB — BASIC METABOLIC PANEL
ANION GAP: 12 (ref 5–15)
BUN: 11 mg/dL (ref 6–20)
CO2: 26 mmol/L (ref 22–32)
Calcium: 10 mg/dL (ref 8.9–10.3)
Chloride: 105 mmol/L (ref 101–111)
Creatinine, Ser: 0.76 mg/dL (ref 0.44–1.00)
GFR calc Af Amer: >60 mL/min (ref >60)
GFR calc non Af Amer: >60 mL/min (ref >60)
GLUCOSE: 101 mg/dL — AB (ref 65–99)
POTASSIUM: 3.8 mmol/L (ref 3.5–5.1)
Sodium: 143 mmol/L (ref 135–145)

## 2017-12-10 LAB — PROTIME-INR
INR: 0.87
Prothrombin Time: 11.7 seconds (ref 11.4–15.2)

## 2017-12-10 MED ORDER — FENTANYL CITRATE (PF) 100 MCG/2ML IJ SOLN
INTRAMUSCULAR | Status: AC | PRN
  Administered 2017-12-10: 50 ug via INTRAVENOUS
  Administered 2017-12-10 (×2): 25 ug via INTRAVENOUS

## 2017-12-10 MED ORDER — HEPARIN SOD (PORK) LOCK FLUSH 100 UNIT/ML IV SOLN
INTRAVENOUS | Status: AC
  Filled 2017-12-10: qty 5

## 2017-12-10 MED ORDER — FENTANYL CITRATE (PF) 100 MCG/2ML IJ SOLN
INTRAMUSCULAR | Status: AC
  Filled 2017-12-10: qty 4

## 2017-12-10 MED ORDER — LIDOCAINE HCL 1 % IJ SOLN
INTRAMUSCULAR | Status: AC
  Filled 2017-12-10: qty 20

## 2017-12-10 MED ORDER — CEFAZOLIN SODIUM-DEXTROSE 2-4 GM/100ML-% IV SOLN
2.0000 g | INTRAVENOUS | Status: AC
  Administered 2017-12-10: 2 g via INTRAVENOUS

## 2017-12-10 MED ORDER — SODIUM CHLORIDE 0.9 % IV SOLN
INTRAVENOUS | Status: DC
  Administered 2017-12-10: 08:00:00 via INTRAVENOUS

## 2017-12-10 MED ORDER — MIDAZOLAM HCL 2 MG/2ML IJ SOLN
INTRAMUSCULAR | Status: AC | PRN
  Administered 2017-12-10 (×2): 0.5 mg via INTRAVENOUS
  Administered 2017-12-10: 1 mg via INTRAVENOUS

## 2017-12-10 MED ORDER — MIDAZOLAM HCL 2 MG/2ML IJ SOLN
INTRAMUSCULAR | Status: AC
  Filled 2017-12-10: qty 4

## 2017-12-10 MED ORDER — LIDOCAINE-EPINEPHRINE (PF) 2 %-1:200000 IJ SOLN
INTRAMUSCULAR | Status: AC
  Filled 2017-12-10: qty 20

## 2017-12-10 MED ORDER — CEFAZOLIN SODIUM-DEXTROSE 2-4 GM/100ML-% IV SOLN
INTRAVENOUS | Status: AC
  Administered 2017-12-10: 2 g via INTRAVENOUS
  Filled 2017-12-10: qty 100

## 2017-12-10 MED ORDER — HEPARIN SOD (PORK) LOCK FLUSH 100 UNIT/ML IV SOLN
INTRAVENOUS | Status: AC | PRN
  Administered 2017-12-10: 500 [IU] via INTRAVENOUS

## 2017-12-10 NOTE — Discharge Instructions (Signed)
Moderate Conscious Sedation, Adult, Care After °These instructions provide you with information about caring for yourself after your procedure. Your health care provider may also give you more specific instructions. Your treatment has been planned according to current medical practices, but problems sometimes occur. Call your health care provider if you have any problems or questions after your procedure. °What can I expect after the procedure? °After your procedure, it is common: °· To feel sleepy for several hours. °· To feel clumsy and have poor balance for several hours. °· To have poor judgment for several hours. °· To vomit if you eat too soon. ° °Follow these instructions at home: °For at least 24 hours after the procedure: ° °· Do not: °? Participate in activities where you could fall or become injured. °? Drive. °? Use heavy machinery. °? Drink alcohol. °? Take sleeping pills or medicines that cause drowsiness. °? Make important decisions or sign legal documents. °? Take care of children on your own. °· Rest. °Eating and drinking °· Follow the diet recommended by your health care provider. °· If you vomit: °? Drink water, juice, or soup when you can drink without vomiting. °? Make sure you have little or no nausea before eating solid foods. °General instructions °· Have a responsible adult stay with you until you are awake and alert. °· Take over-the-counter and prescription medicines only as told by your health care provider. °· If you smoke, do not smoke without supervision. °· Keep all follow-up visits as told by your health care provider. This is important. °Contact a health care provider if: °· You keep feeling nauseous or you keep vomiting. °· You feel light-headed. °· You develop a rash. °· You have a fever. °Get help right away if: °· You have trouble breathing. °This information is not intended to replace advice given to you by your health care provider. Make sure you discuss any questions you have  with your health care provider. °Document Released: 05/07/2013 Document Revised: 12/20/2015 Document Reviewed: 11/06/2015 °Elsevier Interactive Patient Education © 2018 [employer] ° ° °Implanted Port Insertion, Care After °This sheet gives you information about how to care for yourself after your procedure. Your health care provider may also give you more specific instructions. If you have problems or questions, contact your health care provider. °What can I expect after the procedure? °After your procedure, it is common to have: °· Discomfort at the port insertion site. °· Bruising on the skin over the port. This should improve over 3-4 days. ° °Follow these instructions at home: °Port care °· After your port is placed, you will get a manufacturer's information card. The card has information about your port. Keep this card with you at all times. °· Take care of the port as told by your health care provider. Ask your health care provider if you or a family member can get training for taking care of the port at home. A home health care nurse may also take care of the port. °· Make sure to remember what type of port you have. °Incision care °· Follow instructions from your health care provider about how to take care of your port insertion site. Make sure you: °? Wash your hands with soap and water before you change your bandage (dressing). If soap and water are not available, use hand sanitizer. °? Change your dressing as told by your health care provider.  You may remove your dressing tomorrow. °? Leave skin glue in place. These skin closures   may need to stay in place for 2 weeks or longer. If adhesive strip edges start to loosen and curl up, you may trim the loose edges. Do not remove adhesive strips completely unless your health care provider tells you to do that.  DO NOT use EMLA cream for 2 weeks after port placement as this cream will remove the surgical glue on your incision. °· Check your port insertion  site every day for signs of infection. Check for: °? More redness, swelling, or pain. °? More fluid or blood. °? Warmth. °? Pus or a bad smell. °General instructions °· Do not take baths, swim, or use a hot tub until your health care provider approves.  You may shower tomorrow. °· Do not lift anything that is heavier than 10 lb (4.5 kg) for a week, or as told by your health care provider. °· Ask your health care provider when it is okay to: °? Return to work or school. °? Resume usual physical activities or sports. °· Do not drive for 24 hours if you were given a medicine to help you relax (sedative). °· Take over-the-counter and prescription medicines only as told by your health care provider. °· Wear a medical alert bracelet in case of an emergency. This will tell any health care providers that you have a port. °· Keep all follow-up visits as told by your health care provider. This is important. °Contact a health care provider if: °· You have a fever or chills. °· You have more redness, swelling, or pain around your port insertion site. °· You have more fluid or blood coming from your port insertion site. °· Your port insertion site feels warm to the touch. °· You have pus or a bad smell coming from the port insertion site. °Get help right away if: °· You have chest pain or shortness of breath. °· You have bleeding from your port that you cannot control. °Summary °· Take care of the port as told by your health care provider. °· Change your dressing as told by your health care provider. °· Keep all follow-up visits as told by your health care provider. °This information is not intended to replace advice given to you by your health care provider. Make sure you discuss any questions you have with your health care provider. °Document Released: 05/07/2013 Document Revised: 06/07/2016 Document Reviewed: 06/07/2016 °Elsevier Interactive Patient Education © 2017 Else[employer] Inc. ° °

## 2017-12-10 NOTE — Consult Note (Signed)
Chief Complaint: Patient was seen in consultation today for Port-A-Cath placement  Referring Physician(s): Wyatt Portela  Supervising Physician: Daryll Brod  Patient Status: [place]  History of Present Illness: Alexis Mcclain is a 67 y.o. female smoker with history of bladder carcinoma diagnosed in March of this year, status post TURBT, who presents today for Port-A-Cath placement for chemotherapy.  Past Medical History:  Diagnosis Date  . Anxiety   . Cancer ([place])    bladder   . Dizzy    occ last week or so  . Dyspnea    with exertion  . Hematuria last 3 months  . History of blood transfusion 1980   after surgery for ovarian cyst rupture lost pregnancy  . UTI (urinary tract infection)    on ciprofloxacin    Past Surgical History:  Procedure Laterality Date  . ABDOMINAL HYSTERECTOMY  1990's   partial 1 ovary left  . colonscopy  yrs ago  . surgery for ovarian cyst   1990's  . TRANSURETHRAL RESECTION OF BLADDER TUMOR N/A 10/26/2017   Procedure: TRANSURETHRAL RESECTION OF BLADDER TUMOR / CYSTOSCOPY(TURBT);  Surgeon: Ceasar Mons, MD;  Location: [facility];  Service: Urology;  Laterality: N/A;  ONLY NEEDS 90 MIN  . TUBAL LIGATION  1981   laparoscopic    Allergies: Tape  Medications: Prior to Admission medications   Medication Sig Start Date End Date Taking? Authorizing Provider  HYDROcodone-acetaminophen (NORCO) 5-325 MG tablet Take 1 tablet by mouth every 4 (four) hours as needed for moderate pain. 10/26/17   Ceasar Mons, MD  lidocaine-prilocaine (EMLA) cream Apply 1 application topically as needed. 11/21/17   Wyatt Portela, MD  ondansetron (ZOFRAN) 4 MG tablet Take 1 tablet (4 mg total) by mouth daily as needed for nausea or vomiting. Patient not taking: Reported on 11/21/2017 10/26/17 10/26/18  Ceasar Mons, MD  oxybutynin (DITROPAN XL) 10 MG 24 hr tablet Take 1 tablet (10 mg total) by mouth  daily. Patient not taking: Reported on 11/21/2017 10/26/17 10/26/18  Ceasar Mons, MD  phenazopyridine (PYRIDIUM) 200 MG tablet Take 1 tablet (200 mg total) by mouth 3 (three) times daily as needed for pain. Patient not taking: Reported on 11/21/2017 10/26/17 10/26/18  Ceasar Mons, MD  prochlorperazine (COMPAZINE) 10 MG tablet Take 1 tablet (10 mg total) by mouth every 6 (six) hours as needed for nausea or vomiting. 11/21/17   Wyatt Portela, MD     No family history on file.  Social History   Socioeconomic History  . Marital status: Divorced    Spouse name: Not on file  . Number of children: Not on file  . Years of education: Not on file  . Highest education level: Not on file  Occupational History  . Not on file  Social Needs  . Financial resource strain: Not on file  . Food insecurity:    Worry: Not on file    Inability: Not on file  . Transportation needs:    Medical: Not on file    Non-medical: Not on file  Tobacco Use  . Smoking status: Current Every Day Smoker    Packs/day: 1.50    Years: 42.00    Pack years: 63.00    Types: Cigarettes  . Smokeless tobacco: Never Used  Substance and Sexual Activity  . Alcohol use: No    Frequency: Never  . Drug use: No  . Sexual activity: Not on file  Lifestyle  .  Physical activity:    Days per week: Not on file    Minutes per session: Not on file  . Stress: Not on file  Relationships  . Social connections:    Talks on phone: Not on file    Gets together: Not on file    Attends religious service: Not on file    Active member of club or organization: Not on file    Attends meetings of clubs or organizations: Not on file    Relationship status: Not on file  Other Topics Concern  . Not on file  Social History Narrative  . Not on file      Review of Systems currently denies fever, headache, chest pain, dyspnea, cough, abdominal/back pain, nausea, vomiting or bleeding.  Vital Signs: BP (!) 147/79  (BP Location: Right Arm)   Pulse 80   Temp 98.7 F (37.1 C) (Oral)   Resp 18   SpO2 96%   Physical Exam awake, alert.  Chest with distant breath sounds bilaterally.  Heart with regular rate and rhythm.  Abdomen soft, positive bowel sounds, nontender.  No lower extremity edema.  Imaging: Nm Pet Image Initial (pi) Skull Base To Thigh  Result Date: 11/28/2017 CLINICAL DATA:  Initial treatment strategy for bladder carcinoma. EXAM: NUCLEAR MEDICINE PET SKULL BASE TO THIGH TECHNIQUE: 7.2 mCi F-18 FDG was injected intravenously. Full-ring PET imaging was performed from the skull base to thigh after the radiotracer. CT data was obtained and used for attenuation correction and anatomic localization. Fasting blood glucose: 106 mg/dl COMPARISON:  01/02/2008 FINDINGS: Mediastinal blood pool activity: SUV max 2.0 NECK: No hypermetabolic lymph nodes in the neck. Incidental CT findings: none CHEST: No hypermetabolic mediastinal or hilar nodes. No suspicious pulmonary nodules on the CT scan. Incidental CT findings: none ABDOMEN/PELVIS: No abnormal hypermetabolic activity within the liver, pancreas, adrenal glands, or spleen. No hypermetabolic lymph nodes in the abdomen or pelvis. The bladder contains excreted FDG which limits evaluation for neoplasm. Incidental CT findings: Post hysterectomy. SKELETON: No focal hypermetabolic activity to suggest skeletal metastasis. Within the musculature of the upper RIGHT thigh lateral to the femoral metaphysis, there is a fat containing lesion with central soft tissue density measuring 18 mm by 17 mm by 32 mm. The central soft tissue component has mild metabolic activity SUV max equal 3.0. Incidental CT findings: none IMPRESSION: 1. No evidence of metastatic bladder cancer by FDG PET imaging. 2. Bladder is poorly evaluated due to FDG within urine. 3. Mixed fat and soft tissue lesion within the proximal musculature of the RIGHT thigh is indeterminate with differential including  inflammation or hemorrhage within a lipoma versus well differentiated liposarcoma. Metastatic lesion is not favored. Recommend MRI with contrast of the RIGHT thigh for further characterization. Electronically Signed   By: Suzy Bouchard M.D.   On: 11/28/2017 11:20    Labs:  CBC: Recent Labs    10/26/17 1307 10/27/17 0548 12/10/17 0757  WBC  --   --  8.2  HGB 12.6 9.6* 12.6  HCT  --  30.0* 40.1  PLT  --   --  463*    COAGS: Recent Labs    12/10/17 0757  INR 0.87    BMP: Recent Labs    12/10/17 0757  NA 143  K 3.8  CL 105  CO2 26  GLUCOSE 101*  BUN 11  CALCIUM 10.0  CREATININE 0.76  GFRNONAA >60  GFRAA >60    LIVER FUNCTION TESTS: No results for input(s): BILITOT, AST, ALT,  ALKPHOS, PROT, ALBUMIN in the last 8760 hours.  TUMOR MARKERS: No results for input(s): AFPTM, CEA, CA199, CHROMGRNA in the last 8760 hours.  Assessment and Plan: 67 y.o. female smoker with history of bladder carcinoma diagnosed in March of this year, status post TURBT, who presents today for Port-A-Cath placement for chemotherapy.Risks and benefits of image guided port-a-catheter placement was discussed with the patient/daughter including, but not limited to bleeding, infection, pneumothorax, or fibrin sheath development and need for additional procedures.  All of the patient's questions were answered, patient is agreeable to proceed. Consent signed and in chart.      Thank you for this interesting consult.  I greatly enjoyed meeting TAMEY WANEK and look forward to participating in their care.  A copy of this report was sent to the requesting provider on this date.  Electronically Signed: D. Rowe Robert, PA-C 12/10/2017, 8:31 AM   I spent a total of  25 minutes   in face to face in clinical consultation, greater than 50% of which was counseling/coordinating care for Port-A-Cath placement

## 2017-12-10 NOTE — Procedures (Signed)
Bladder ca  S/p RT IJ POWER PORT  Tip svcra No comp Stable EBL 0 READY FOR USE Full report in pacs

## 2017-12-12 ENCOUNTER — Inpatient Hospital Stay: Payer: BLUE CROSS/BLUE SHIELD

## 2017-12-12 ENCOUNTER — Inpatient Hospital Stay: Payer: BLUE CROSS/BLUE SHIELD | Attending: Oncology

## 2017-12-12 VITALS — BP 146/82 | HR 91 | Temp 98.6°F | Resp 18

## 2017-12-12 DIAGNOSIS — R0609 Other forms of dyspnea: Secondary | ICD-10-CM | POA: Diagnosis not present

## 2017-12-12 DIAGNOSIS — D649 Anemia, unspecified: Secondary | ICD-10-CM | POA: Diagnosis not present

## 2017-12-12 DIAGNOSIS — R110 Nausea: Secondary | ICD-10-CM | POA: Diagnosis not present

## 2017-12-12 DIAGNOSIS — C679 Malignant neoplasm of bladder, unspecified: Secondary | ICD-10-CM | POA: Diagnosis present

## 2017-12-12 DIAGNOSIS — Z95828 Presence of other vascular implants and grafts: Secondary | ICD-10-CM | POA: Insufficient documentation

## 2017-12-12 DIAGNOSIS — D494 Neoplasm of unspecified behavior of bladder: Secondary | ICD-10-CM

## 2017-12-12 DIAGNOSIS — Z5111 Encounter for antineoplastic chemotherapy: Secondary | ICD-10-CM | POA: Insufficient documentation

## 2017-12-12 DIAGNOSIS — C779 Secondary and unspecified malignant neoplasm of lymph node, unspecified: Secondary | ICD-10-CM | POA: Diagnosis not present

## 2017-12-12 LAB — CBC WITH DIFFERENTIAL (CANCER CENTER ONLY)
BASOS ABS: 0.1 10*3/uL (ref 0.0–0.1)
BASOS PCT: 1 %
EOS ABS: 0.2 10*3/uL (ref 0.0–0.5)
Eosinophils Relative: 4 %
HEMATOCRIT: 35.5 % (ref 34.8–46.6)
HEMOGLOBIN: 11.8 g/dL (ref 11.6–15.9)
Lymphocytes Relative: 34 %
Lymphs Abs: 2.2 10*3/uL (ref 0.9–3.3)
MCH: 27.7 pg (ref 25.1–34.0)
MCHC: 33.2 g/dL (ref 31.5–36.0)
MCV: 83.4 fL (ref 79.5–101.0)
MONO ABS: 0.7 10*3/uL (ref 0.1–0.9)
MONOS PCT: 11 %
NEUTROS ABS: 3.2 10*3/uL (ref 1.5–6.5)
NEUTROS PCT: 50 %
Platelet Count: 368 10*3/uL (ref 145–400)
RBC: 4.26 MIL/uL (ref 3.70–5.45)
RDW: 14.9 % — AB (ref 11.2–14.5)
WBC Count: 6.3 10*3/uL (ref 3.9–10.3)

## 2017-12-12 LAB — CMP (CANCER CENTER ONLY)
ALK PHOS: 121 U/L (ref 40–150)
ALT: 7 U/L (ref 0–55)
ANION GAP: 7 (ref 3–11)
AST: 11 U/L (ref 5–34)
Albumin: 3.7 g/dL (ref 3.5–5.0)
BILIRUBIN TOTAL: 0.2 mg/dL (ref 0.2–1.2)
BUN: 7 mg/dL (ref 7–26)
CALCIUM: 9.9 mg/dL (ref 8.4–10.4)
CO2: 27 mmol/L (ref 22–29)
CREATININE: 0.75 mg/dL (ref 0.60–1.10)
Chloride: 108 mmol/L (ref 98–109)
GFR, Estimated: >60 mL/min (ref >60)
Glucose, Bld: 88 mg/dL (ref 70–140)
Potassium: 3.9 mmol/L (ref 3.5–5.1)
Sodium: 142 mmol/L (ref 136–145)
TOTAL PROTEIN: 7.3 g/dL (ref 6.4–8.3)

## 2017-12-12 MED ORDER — FOSAPREPITANT DIMEGLUMINE INJECTION 150 MG
Freq: Once | INTRAVENOUS | Status: AC
  Administered 2017-12-12: 12:00:00 via INTRAVENOUS
  Filled 2017-12-12: qty 5

## 2017-12-12 MED ORDER — PALONOSETRON HCL INJECTION 0.25 MG/5ML
0.2500 mg | Freq: Once | INTRAVENOUS | Status: AC
  Administered 2017-12-12: 0.25 mg via INTRAVENOUS

## 2017-12-12 MED ORDER — SODIUM CHLORIDE 0.9 % IV SOLN
1000.0000 mg/m2 | Freq: Once | INTRAVENOUS | Status: AC
  Administered 2017-12-12: 1634 mg via INTRAVENOUS
  Filled 2017-12-12: qty 42.98

## 2017-12-12 MED ORDER — SODIUM CHLORIDE 0.9 % IV SOLN
Freq: Once | INTRAVENOUS | Status: AC
Start: 2017-12-12 — End: 2017-12-12
  Administered 2017-12-12: 12:00:00 via INTRAVENOUS

## 2017-12-12 MED ORDER — PALONOSETRON HCL INJECTION 0.25 MG/5ML
INTRAVENOUS | Status: AC
  Filled 2017-12-12: qty 5

## 2017-12-12 MED ORDER — SODIUM CHLORIDE 0.9% FLUSH
10.0000 mL | INTRAVENOUS | Status: DC | PRN
  Administered 2017-12-12: 10 mL
  Filled 2017-12-12: qty 10

## 2017-12-12 MED ORDER — POTASSIUM CHLORIDE 2 MEQ/ML IV SOLN
Freq: Once | INTRAVENOUS | Status: AC
  Administered 2017-12-12: 10:00:00 via INTRAVENOUS
  Filled 2017-12-12: qty 10

## 2017-12-12 MED ORDER — CISPLATIN CHEMO INJECTION 100MG/100ML
70.0000 mg/m2 | Freq: Once | INTRAVENOUS | Status: AC
  Administered 2017-12-12: 113 mg via INTRAVENOUS
  Filled 2017-12-12: qty 113

## 2017-12-12 MED ORDER — HEPARIN SOD (PORK) LOCK FLUSH 100 UNIT/ML IV SOLN
500.0000 [IU] | Freq: Once | INTRAVENOUS | Status: AC | PRN
Start: 2017-12-12 — End: 2017-12-12
  Administered 2017-12-12: 500 [IU]
  Filled 2017-12-12: qty 5

## 2017-12-12 NOTE — Patient Instructions (Signed)
[facility] Discharge Instructions for Patients Receiving Chemotherapy  Today you received the following chemotherapy agents Gemzar/Cisplatin  To help prevent nausea and vomiting after your treatment, we encourage you to take your nausea medication as directed by your MD.   If you develop nausea and vomiting that is not controlled by your nausea medication, call the clinic.   BELOW ARE SYMPTOMS THAT SHOULD BE REPORTED IMMEDIATELY:  *FEVER GREATER THAN 100.5 F  *CHILLS WITH OR WITHOUT FEVER  NAUSEA AND VOMITING THAT IS NOT CONTROLLED WITH YOUR NAUSEA MEDICATION  *UNUSUAL SHORTNESS OF BREATH  *UNUSUAL BRUISING OR BLEEDING  TENDERNESS IN MOUTH AND THROAT WITH OR WITHOUT PRESENCE OF ULCERS  *URINARY PROBLEMS  *BOWEL PROBLEMS  UNUSUAL RASH Items with * indicate a potential emergency and should be followed up as soon as possible.  Feel free to call the clinic should you have any questions or concerns. The clinic phone number is [phone].  Please show the [place] at check-in to the Emergency Department and triage nurse.   Gemcitabine injection Gemzar What is this medicine? GEMCITABINE (jem SIT a been) is a chemotherapy drug. This medicine is used to treat many types of cancer like breast cancer, lung cancer, pancreatic cancer, and ovarian cancer. This medicine may be used for other purposes; ask your health care provider or pharmacist if you have questions. COMMON BRAND NAME(S): Gemzar What should I tell my health care provider before I take this medicine? They need to know if you have any of these conditions: -blood disorders -infection -kidney disease -liver disease -recent or ongoing radiation therapy -an unusual or allergic reaction to gemcitabine, other chemotherapy, other medicines, foods, dyes, or preservatives -pregnant or trying to get pregnant -breast-feeding How should I use this medicine? This drug is given as an infusion into a  vein. It is administered in a hospital or clinic by a specially trained health care professional. Talk to your pediatrician regarding the use of this medicine in children. Special care may be needed. Overdosage: If you think you have taken too much of this medicine contact a poison control center or emergency room at once. NOTE: This medicine is only for you. Do not share this medicine with others. What if I miss a dose? It is important not to miss your dose. Call your doctor or health care professional if you are unable to keep an appointment. What may interact with this medicine? -medicines to increase blood counts like filgrastim, pegfilgrastim, sargramostim -some other chemotherapy drugs like cisplatin -vaccines Talk to your doctor or health care professional before taking any of these medicines: -acetaminophen -aspirin -ibuprofen -ketoprofen -naproxen This list may not describe all possible interactions. Give your health care provider a list of all the medicines, herbs, non-prescription drugs, or dietary supplements you use. Also tell them if you smoke, drink alcohol, or use illegal drugs. Some items may interact with your medicine. What should I watch for while using this medicine? Visit your doctor for checks on your progress. This drug may make you feel generally unwell. This is not uncommon, as chemotherapy can affect healthy cells as well as cancer cells. Report any side effects. Continue your course of treatment even though you feel ill unless your doctor tells you to stop. In some cases, you may be given additional medicines to help with side effects. Follow all directions for their use. Call your doctor or health care professional for advice if you get a fever, chills or sore throat, or other  symptoms of a cold or flu. Do not treat yourself. This drug decreases your body's ability to fight infections. Try to avoid being around people who are sick. This medicine may increase your  risk to bruise or bleed. Call your doctor or health care professional if you notice any unusual bleeding. Be careful brushing and flossing your teeth or using a toothpick because you may get an infection or bleed more easily. If you have any dental work done, tell your dentist you are receiving this medicine. Avoid taking products that contain aspirin, acetaminophen, ibuprofen, naproxen, or ketoprofen unless instructed by your doctor. These medicines may hide a fever. Women should inform their doctor if they wish to become pregnant or think they might be pregnant. There is a potential for serious side effects to an unborn child. Talk to your health care professional or pharmacist for more information. Do not breast-feed an infant while taking this medicine. What side effects may I notice from receiving this medicine? Side effects that you should report to your doctor or health care professional as soon as possible: -allergic reactions like skin rash, itching or hives, swelling of the face, lips, or tongue -low blood counts - this medicine may decrease the number of white blood cells, red blood cells and platelets. You may be at increased risk for infections and bleeding. -signs of infection - fever or chills, cough, sore throat, pain or difficulty passing urine -signs of decreased platelets or bleeding - bruising, pinpoint red spots on the skin, black, tarry stools, blood in the urine -signs of decreased red blood cells - unusually weak or tired, fainting spells, lightheadedness -breathing problems -chest pain -mouth sores -nausea and vomiting -pain, swelling, redness at site where injected -pain, tingling, numbness in the hands or feet -stomach pain -swelling of ankles, feet, hands -unusual bleeding Side effects that usually do not require medical attention (report to your doctor or health care professional if they continue or are bothersome): -constipation -diarrhea -hair loss -loss of  appetite -stomach upset This list may not describe all possible side effects. Call your doctor for medical advice about side effects. You may report side effects to FDA at 1-800-FDA-1088. Where should I keep my medicine? This drug is given in a hospital or clinic and will not be stored at home. NOTE: This sheet is a summary. It may not cover all possible information. If you have questions about this medicine, talk to your doctor, pharmacist, or health care provider.  2018 Elsevier/Gold Standard (2007-11-26 18:45:54)   Cisplatin injection What is this medicine? CISPLATIN (SIS pla tin) is a chemotherapy drug. It targets fast dividing cells, like cancer cells, and causes these cells to die. This medicine is used to treat many types of cancer like bladder, ovarian, and testicular cancers. This medicine may be used for other purposes; ask your health care provider or pharmacist if you have questions. COMMON BRAND NAME(S): Platinol, Platinol -AQ What should I tell my health care provider before I take this medicine? They need to know if you have any of these conditions: -blood disorders -hearing problems -kidney disease -recent or ongoing radiation therapy -an unusual or allergic reaction to cisplatin, carboplatin, other chemotherapy, other medicines, foods, dyes, or preservatives -pregnant or trying to get pregnant -breast-feeding How should I use this medicine? This drug is given as an infusion into a vein. It is administered in a hospital or clinic by a specially trained health care professional. Talk to your pediatrician regarding the use of this medicine  in children. Special care may be needed. Overdosage: If you think you have taken too much of this medicine contact a poison control center or emergency room at once. NOTE: This medicine is only for you. Do not share this medicine with others. What if I miss a dose? It is important not to miss a dose. Call your doctor or health care  professional if you are unable to keep an appointment. What may interact with this medicine? -dofetilide -foscarnet -medicines for seizures -medicines to increase blood counts like filgrastim, pegfilgrastim, sargramostim -probenecid -pyridoxine used with altretamine -rituximab -some antibiotics like amikacin, gentamicin, neomycin, polymyxin B, streptomycin, tobramycin -sulfinpyrazone -vaccines -zalcitabine Talk to your doctor or health care professional before taking any of these medicines: -acetaminophen -aspirin -ibuprofen -ketoprofen -naproxen This list may not describe all possible interactions. Give your health care provider a list of all the medicines, herbs, non-prescription drugs, or dietary supplements you use. Also tell them if you smoke, drink alcohol, or use illegal drugs. Some items may interact with your medicine. What should I watch for while using this medicine? Your condition will be monitored carefully while you are receiving this medicine. You will need important blood work done while you are taking this medicine. This drug may make you feel generally unwell. This is not uncommon, as chemotherapy can affect healthy cells as well as cancer cells. Report any side effects. Continue your course of treatment even though you feel ill unless your doctor tells you to stop. In some cases, you may be given additional medicines to help with side effects. Follow all directions for their use. Call your doctor or health care professional for advice if you get a fever, chills or sore throat, or other symptoms of a cold or flu. Do not treat yourself. This drug decreases your body's ability to fight infections. Try to avoid being around people who are sick. This medicine may increase your risk to bruise or bleed. Call your doctor or health care professional if you notice any unusual bleeding. Be careful brushing and flossing your teeth or using a toothpick because you may get an infection  or bleed more easily. If you have any dental work done, tell your dentist you are receiving this medicine. Avoid taking products that contain aspirin, acetaminophen, ibuprofen, naproxen, or ketoprofen unless instructed by your doctor. These medicines may hide a fever. Do not become pregnant while taking this medicine. Women should inform their doctor if they wish to become pregnant or think they might be pregnant. There is a potential for serious side effects to an unborn child. Talk to your health care professional or pharmacist for more information. Do not breast-feed an infant while taking this medicine. Drink fluids as directed while you are taking this medicine. This will help protect your kidneys. Call your doctor or health care professional if you get diarrhea. Do not treat yourself. What side effects may I notice from receiving this medicine? Side effects that you should report to your doctor or health care professional as soon as possible: -allergic reactions like skin rash, itching or hives, swelling of the face, lips, or tongue -signs of infection - fever or chills, cough, sore throat, pain or difficulty passing urine -signs of decreased platelets or bleeding - bruising, pinpoint red spots on the skin, black, tarry stools, nosebleeds -signs of decreased red blood cells - unusually weak or tired, fainting spells, lightheadedness -breathing problems -changes in hearing -gout pain -low blood counts - This drug may decrease the number of  white blood cells, red blood cells and platelets. You may be at increased risk for infections and bleeding. -nausea and vomiting -pain, swelling, redness or irritation at the injection site -pain, tingling, numbness in the hands or feet -problems with balance, movement -trouble passing urine or change in the amount of urine Side effects that usually do not require medical attention (report to your doctor or health care professional if they continue or are  bothersome): -changes in vision -loss of appetite -metallic taste in the mouth or changes in taste This list may not describe all possible side effects. Call your doctor for medical advice about side effects. You may report side effects to FDA at 1-800-FDA-1088. Where should I keep my medicine? This drug is given in a hospital or clinic and will not be stored at home. NOTE: This sheet is a summary. It may not cover all possible information. If you have questions about this medicine, talk to your doctor, pharmacist, or health care provider.  2018 Elsevier/Gold Standard (2007-10-22 14:40:54)

## 2017-12-19 ENCOUNTER — Telehealth: Payer: Self-pay

## 2017-12-19 ENCOUNTER — Inpatient Hospital Stay: Payer: BLUE CROSS/BLUE SHIELD

## 2017-12-19 ENCOUNTER — Inpatient Hospital Stay (HOSPITAL_BASED_OUTPATIENT_CLINIC_OR_DEPARTMENT_OTHER): Payer: BLUE CROSS/BLUE SHIELD | Admitting: Oncology

## 2017-12-19 VITALS — BP 140/75 | HR 76 | Temp 98.4°F | Resp 18 | Ht 62.0 in | Wt 129.6 lb

## 2017-12-19 DIAGNOSIS — R0600 Dyspnea, unspecified: Secondary | ICD-10-CM | POA: Diagnosis not present

## 2017-12-19 DIAGNOSIS — R110 Nausea: Secondary | ICD-10-CM

## 2017-12-19 DIAGNOSIS — Z95828 Presence of other vascular implants and grafts: Secondary | ICD-10-CM

## 2017-12-19 DIAGNOSIS — D494 Neoplasm of unspecified behavior of bladder: Secondary | ICD-10-CM

## 2017-12-19 DIAGNOSIS — C679 Malignant neoplasm of bladder, unspecified: Secondary | ICD-10-CM

## 2017-12-19 LAB — CBC WITH DIFFERENTIAL (CANCER CENTER ONLY)
BASOS ABS: 0 10*3/uL (ref 0.0–0.1)
Basophils Relative: 1 %
EOS ABS: 0.2 10*3/uL (ref 0.0–0.5)
Eosinophils Relative: 3 %
HEMATOCRIT: 36.3 % (ref 34.8–46.6)
HEMOGLOBIN: 11.5 g/dL — AB (ref 11.6–15.9)
Lymphocytes Relative: 53 %
Lymphs Abs: 3 10*3/uL (ref 0.9–3.3)
MCH: 27.1 pg (ref 25.1–34.0)
MCHC: 31.7 g/dL (ref 31.5–36.0)
MCV: 85.4 fL (ref 79.5–101.0)
MONOS PCT: 9 %
Monocytes Absolute: 0.5 10*3/uL (ref 0.1–0.9)
NEUTROS ABS: 1.8 10*3/uL (ref 1.5–6.5)
Neutrophils Relative %: 34 %
Platelet Count: 221 10*3/uL (ref 145–400)
RBC: 4.25 MIL/uL (ref 3.70–5.45)
RDW: 14 % (ref 11.2–14.5)
WBC: 5.5 10*3/uL (ref 3.9–10.3)

## 2017-12-19 LAB — CMP (CANCER CENTER ONLY)
ALK PHOS: 111 U/L (ref 40–150)
ALT: 13 U/L (ref 0–55)
ANION GAP: 8 (ref 3–11)
AST: 17 U/L (ref 5–34)
Albumin: 3.7 g/dL (ref 3.5–5.0)
BUN: 9 mg/dL (ref 7–26)
CALCIUM: 9.8 mg/dL (ref 8.4–10.4)
CO2: 29 mmol/L (ref 22–29)
CREATININE: 0.81 mg/dL (ref 0.60–1.10)
Chloride: 102 mmol/L (ref 98–109)
Glucose, Bld: 106 mg/dL (ref 70–140)
Potassium: 3.6 mmol/L (ref 3.5–5.1)
Sodium: 139 mmol/L (ref 136–145)
TOTAL PROTEIN: 7.2 g/dL (ref 6.4–8.3)
Total Bilirubin: 0.3 mg/dL (ref 0.2–1.2)

## 2017-12-19 MED ORDER — PROCHLORPERAZINE MALEATE 10 MG PO TABS
ORAL_TABLET | ORAL | Status: AC
  Filled 2017-12-19: qty 1

## 2017-12-19 MED ORDER — SODIUM CHLORIDE 0.9 % IV SOLN
Freq: Once | INTRAVENOUS | Status: AC
  Administered 2017-12-19: 10:00:00 via INTRAVENOUS

## 2017-12-19 MED ORDER — HEPARIN SOD (PORK) LOCK FLUSH 100 UNIT/ML IV SOLN
500.0000 [IU] | Freq: Once | INTRAVENOUS | Status: AC | PRN
  Administered 2017-12-19: 500 [IU]
  Filled 2017-12-19: qty 5

## 2017-12-19 MED ORDER — SODIUM CHLORIDE 0.9% FLUSH
10.0000 mL | INTRAVENOUS | Status: DC | PRN
  Administered 2017-12-19: 10 mL
  Filled 2017-12-19: qty 10

## 2017-12-19 MED ORDER — PROCHLORPERAZINE MALEATE 10 MG PO TABS
10.0000 mg | ORAL_TABLET | Freq: Once | ORAL | Status: AC
  Administered 2017-12-19: 10 mg via ORAL

## 2017-12-19 MED ORDER — SODIUM CHLORIDE 0.9 % IV SOLN
1000.0000 mg/m2 | Freq: Once | INTRAVENOUS | Status: AC
  Administered 2017-12-19: 1634 mg via INTRAVENOUS
  Filled 2017-12-19: qty 42.98

## 2017-12-19 NOTE — Telephone Encounter (Signed)
Printed avs and calender of upcoming appointments. Per 5/22 los

## 2017-12-19 NOTE — Patient Instructions (Signed)
[facility] Discharge Instructions for Patients Receiving Chemotherapy  Today you received the following chemotherapy agents Gemzar To help prevent nausea and vomiting after your treatment, we encourage you to take your nausea medication as directed by your MD.   If you develop nausea and vomiting that is not controlled by your nausea medication, call the clinic.   BELOW ARE SYMPTOMS THAT SHOULD BE REPORTED IMMEDIATELY:  *FEVER GREATER THAN 100.5 F  *CHILLS WITH OR WITHOUT FEVER  NAUSEA AND VOMITING THAT IS NOT CONTROLLED WITH YOUR NAUSEA MEDICATION  *UNUSUAL SHORTNESS OF BREATH  *UNUSUAL BRUISING OR BLEEDING  TENDERNESS IN MOUTH AND THROAT WITH OR WITHOUT PRESENCE OF ULCERS  *URINARY PROBLEMS  *BOWEL PROBLEMS  UNUSUAL RASH Items with * indicate a potential emergency and should be followed up as soon as possible.  Feel free to call the clinic should you have any questions or concerns. The clinic phone number is [phone].  Please show the [facility] at check-in to the Emergency Department and triage nurse.

## 2017-12-19 NOTE — Progress Notes (Signed)
Hematology and Oncology Follow Up Visit  Alexis Mcclain [id number] 1951/06/06 67 y.o. 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  PET CT scan in May 2019 showed no evidence of metastatic disease.   Prior Therapy: TURBT on October 26, 2017 with the final pathology showed Infiltrative high-grade urothelial carcinoma with squamous component invading into muscularis propria with lymphovascular invasion identified.    Current therapy: Neoadjuvant chemotherapy utilizing gemcitabine and cisplatin.  Day 1 of cycle 1 started on Dec 12, 2017.  Interim History: Alexis Mcclain presents today for a follow-up visit.  Since her last visit, she received day 1 of cycle 1 of chemotherapy utilizing gemcitabine and cisplatin.  She tolerated this therapy without any major complications.  She reported grade with nausea and grade 1 fatigue.  She did report some occasional dizziness but no lightheadedness or syncope.  Is able to eat and maintain her food intake as a proper level.  She continues to ambulate without any difficulties.  She denies any hematochezia or melena.  She denies any hematuria or dysuria.  She does not report any headaches, blurry vision, syncope or seizures. Does not report any fevers, chills or sweats.  Does not report any cough, wheezing or hemoptysis.  Does not report any chest pain, palpitation, orthopnea or leg edema.  Does not report any nausea, vomiting or abdominal pain.  Does not report any constipation or diarrhea.  Does not report any skeletal complaints.    Does not report frequency, urgency or hematuria.  Does not report any skin rashes or lesions. Does not report any heat or cold intolerance.  Does not report any lymphadenopathy or petechiae.  Does not report any anxiety or depression.  Remaining review of systems is  negative.    Medications: I have reviewed the patient's current medications.  Current Outpatient Medications  Medication Sig Dispense Refill  . HYDROcodone-acetaminophen (NORCO) 5-325 MG tablet Take 1 tablet by mouth every 4 (four) hours as needed for moderate pain. 20 tablet 0  . lidocaine-prilocaine (EMLA) cream Apply 1 application topically as needed. 30 g 0  . ondansetron (ZOFRAN) 4 MG tablet Take 1 tablet (4 mg total) by mouth daily as needed for nausea or vomiting. (Patient not taking: Reported on 11/21/2017) 30 tablet 1  . oxybutynin (DITROPAN XL) 10 MG 24 hr tablet Take 1 tablet (10 mg total) by mouth daily. (Patient not taking: Reported on 11/21/2017) 30 tablet 1  . phenazopyridine (PYRIDIUM) 200 MG tablet Take 1 tablet (200 mg total) by mouth 3 (three) times daily as needed for pain. (Patient not taking: Reported on 11/21/2017) 30 tablet 0  . prochlorperazine (COMPAZINE) 10 MG tablet Take 1 tablet (10 mg total) by mouth every 6 (six) hours as needed for nausea or vomiting. 30 tablet 0   No current facility-administered medications for this visit.    Facility-Administered Medications Ordered in Other Visits  Medication Dose Route Frequency Provider Last Rate Last Dose  . sodium chloride flush (NS) 0.9 % injection 10 mL  10 mL Intracatheter PRN Wyatt Portela, MD   10 mL at 12/19/17 6301     Allergies:  Allergies  Allergen Reactions  . Tape Other (See Comments)    Burns skin    Hurt when pulled off    Past Medical History, Surgical history, Social history, and Family History were reviewed and updated.  Physical Exam: Blood pressure 140/75, pulse 76, temperature 98.4 F (36.9 C), temperature source Oral, resp. rate 18, height 5\' 2"  (1.575 m), weight 129 lb 9.6 oz (58.8 kg), SpO2 99 %.   ECOG: 1 General appearance: alert and cooperative appeared without distress. Head: Normocephalic, without obvious abnormality Oropharynx: No oral thrush or ulcers. Eyes: No scleral  icterus.  Pupils are equal and round reactive to light. Lymph nodes: Cervical, supraclavicular, and axillary nodes normal. Heart:regular rate and rhythm, S1, S2 normal, no murmur, click, rub or gallop Lung:chest clear, no wheezing, rales, normal symmetric air entry Abdomin: soft, non-tender, without masses or organomegaly. Neurological: No motor, sensory deficits.  Intact deep tendon reflexes. Skin: No rashes or lesions.  No ecchymosis or petechiae. Musculoskeletal: No joint deformity or effusion. Psychiatric: Mood and affect are appropriate.    Lab Results: Lab Results  Component Value Date   WBC 6.3 12/12/2017   HGB 11.8 12/12/2017   HCT 35.5 12/12/2017   MCV 83.4 12/12/2017   PLT 368 12/12/2017     Chemistry      Component Value Date/Time   NA 142 12/12/2017 0804   K 3.9 12/12/2017 0804   CL 108 12/12/2017 0804   CO2 27 12/12/2017 0804   BUN 7 12/12/2017 0804   CREATININE 0.75 12/12/2017 0804      Component Value Date/Time   CALCIUM 9.9 12/12/2017 0804   ALKPHOS 121 12/12/2017 0804   AST 11 12/12/2017 0804   ALT 7 12/12/2017 0804   BILITOT 0.2 12/12/2017 0804       Impression and Plan:  68 year old woman with:  1.    T3N0 high-grade urothelial carcinoma with squamous cell differentiation arising of the bladder diagnosed in March 2019.    She is currently receiving neoadjuvant chemotherapy in preparation for radical cystectomy.  He received day 1 of cycle 1 without any major complications.  The plan is to proceed with day 8 of cycle 1 today of her laboratory data is within normal range or close to it.  Plan is to complete 3-4 cycles of therapy prior to proceeding with radical cystectomy.  2.  Renal function surveillance: Her creatinine is normal and will continue to be monitored periodically.  3.  Anemia: Her hemoglobin has improved and back to within normal range.  We will continue to monitor on chemotherapy.  4.  IV access: Port-A-Cath inserted without  complications.  Will remain in use for the duration of chemotherapy.  5.  Antiemetics: Nausea is well managed at this time with Compazine.  No vomiting.  6.  Dyspnea on exertion: Has improved with improvement in her hemoglobin.  She will need cardiac evaluation prior to proceeding with surgery.  7.  Prognosis: The goal of therapy is curative and her performance status is adequate to warrant aggressive therapy.  8.  Follow-up: Will be in the next 2 weeks to start cycle 2 of chemotherapy.  25  minutes was spent with the patient face-to-face today.  More than 50% of time was dedicated to patient counseling, education and answering questions regarding future plan of care.    Zola Button, MD 5/22/20199:20 AM

## 2017-12-19 NOTE — Patient Instructions (Signed)
Implanted Port Home Guide An implanted port is a type of central line that is placed under the skin. Central lines are used to provide IV access when treatment or nutrition needs to be given through a person's veins. Implanted ports are used for long-term IV access. An implanted port may be placed because:  You need IV medicine that would be irritating to the small veins in your hands or arms.  You need long-term IV medicines, such as antibiotics.  You need IV nutrition for a long period.  You need frequent blood draws for lab tests.  You need dialysis.  Implanted ports are usually placed in the chest area, but they can also be placed in the upper arm, the abdomen, or the leg. An implanted port has two main parts:  Reservoir. The reservoir is round and will appear as a small, raised area under your skin. The reservoir is the part where a needle is inserted to give medicines or draw blood.  Catheter. The catheter is a thin, flexible tube that extends from the reservoir. The catheter is placed into a large vein. Medicine that is inserted into the reservoir goes into the catheter and then into the vein.  How will I care for my incision site? Do not get the incision site wet. Bathe or shower as directed by your health care provider. How is my port accessed? Special steps must be taken to access the port:  Before the port is accessed, a numbing cream can be placed on the skin. This helps numb the skin over the port site.  Your health care provider uses a sterile technique to access the port. ? Your health care provider must put on a mask and sterile gloves. ? The skin over your port is cleaned carefully with an antiseptic and allowed to dry. ? The port is gently pinched between sterile gloves, and a needle is inserted into the port.  Only "non-coring" port needles should be used to access the port. Once the port is accessed, a blood return should be checked. This helps ensure that the port  is in the vein and is not clogged.  If your port needs to remain accessed for a constant infusion, a clear (transparent) bandage will be placed over the needle site. The bandage and needle will need to be changed every week, or as directed by your health care provider.  Keep the bandage covering the needle clean and dry. Do not get it wet. Follow your health care provider's instructions on how to take a shower or bath while the port is accessed.  If your port does not need to stay accessed, no bandage is needed over the port.  What is flushing? Flushing helps keep the port from getting clogged. Follow your health care provider's instructions on how and when to flush the port. Ports are usually flushed with saline solution or a medicine called heparin. The need for flushing will depend on how the port is used.  If the port is used for intermittent medicines or blood draws, the port will need to be flushed: ? After medicines have been given. ? After blood has been drawn. ? As part of routine maintenance.  If a constant infusion is running, the port may not need to be flushed.  How long will my port stay implanted? The port can stay in for as long as your health care provider thinks it is needed. When it is time for the port to come out, surgery will be   done to remove it. The procedure is similar to the one performed when the port was put in. When should I seek immediate medical care? When you have an implanted port, you should seek immediate medical care if:  You notice a bad smell coming from the incision site.  You have swelling, redness, or drainage at the incision site.  You have more swelling or pain at the port site or the surrounding area.  You have a fever that is not controlled with medicine.  This information is not intended to replace advice given to you by your health care provider. Make sure you discuss any questions you have with your health care provider. Document  Released: 07/17/2005 Document Revised: 12/23/2015 Document Reviewed: 03/24/2013 Elsevier Interactive Patient Education  2017 [employer]  

## 2017-12-27 ENCOUNTER — Other Ambulatory Visit: Payer: Self-pay | Admitting: *Deleted

## 2017-12-27 MED ORDER — PROCHLORPERAZINE MALEATE 10 MG PO TABS: 10.0000 mg | ORAL_TABLET | Freq: Four times a day (QID) | ORAL | 1 refills | Status: DC | PRN

## 2018-01-02 ENCOUNTER — Inpatient Hospital Stay (HOSPITAL_BASED_OUTPATIENT_CLINIC_OR_DEPARTMENT_OTHER): Payer: Medicare Other | Admitting: Oncology

## 2018-01-02 ENCOUNTER — Inpatient Hospital Stay: Payer: Medicare Other

## 2018-01-02 ENCOUNTER — Inpatient Hospital Stay: Payer: Medicare Other | Attending: Oncology

## 2018-01-02 ENCOUNTER — Encounter: Payer: Self-pay | Admitting: Oncology

## 2018-01-02 VITALS — BP 146/83 | HR 75 | Temp 98.6°F | Resp 18 | Ht 62.0 in | Wt 129.0 lb

## 2018-01-02 DIAGNOSIS — D649 Anemia, unspecified: Secondary | ICD-10-CM | POA: Diagnosis not present

## 2018-01-02 DIAGNOSIS — D494 Neoplasm of unspecified behavior of bladder: Secondary | ICD-10-CM

## 2018-01-02 DIAGNOSIS — C679 Malignant neoplasm of bladder, unspecified: Secondary | ICD-10-CM | POA: Diagnosis not present

## 2018-01-02 DIAGNOSIS — Z95828 Presence of other vascular implants and grafts: Secondary | ICD-10-CM

## 2018-01-02 DIAGNOSIS — R0600 Dyspnea, unspecified: Secondary | ICD-10-CM | POA: Diagnosis not present

## 2018-01-02 DIAGNOSIS — Z5111 Encounter for antineoplastic chemotherapy: Secondary | ICD-10-CM | POA: Diagnosis not present

## 2018-01-02 LAB — CMP (CANCER CENTER ONLY)
ALK PHOS: 114 U/L (ref 40–150)
ALT: 6 U/L (ref 0–55)
ANION GAP: 10 (ref 3–11)
AST: 13 U/L (ref 5–34)
Albumin: 3.6 g/dL (ref 3.5–5.0)
BUN: 7 mg/dL (ref 7–26)
CO2: 28 mmol/L (ref 22–29)
Calcium: 9.3 mg/dL (ref 8.4–10.4)
Chloride: 104 mmol/L (ref 98–109)
Creatinine: 0.81 mg/dL (ref 0.60–1.10)
GFR, Estimated: >60 mL/min (ref >60)
GLUCOSE: 96 mg/dL (ref 70–140)
Potassium: 3.4 mmol/L — ABNORMAL LOW (ref 3.5–5.1)
Sodium: 142 mmol/L (ref 136–145)
TOTAL PROTEIN: 6.9 g/dL (ref 6.4–8.3)

## 2018-01-02 LAB — CBC WITH DIFFERENTIAL (CANCER CENTER ONLY)
Basophils Absolute: 0 10*3/uL (ref 0.0–0.1)
Basophils Relative: 1 %
Eosinophils Absolute: 0.1 10*3/uL (ref 0.0–0.5)
Eosinophils Relative: 2 %
HEMATOCRIT: 34.2 % — AB (ref 34.8–46.6)
HEMOGLOBIN: 10.8 g/dL — AB (ref 11.6–15.9)
LYMPHS ABS: 2.1 10*3/uL (ref 0.9–3.3)
Lymphocytes Relative: 40 %
MCH: 27.3 pg (ref 25.1–34.0)
MCHC: 31.6 g/dL (ref 31.5–36.0)
MCV: 86.6 fL (ref 79.5–101.0)
MONOS PCT: 17 %
Monocytes Absolute: 0.8 10*3/uL (ref 0.1–0.9)
NEUTROS ABS: 2 10*3/uL (ref 1.5–6.5)
NEUTROS PCT: 40 %
Platelet Count: 604 10*3/uL — ABNORMAL HIGH (ref 145–400)
RBC: 3.95 MIL/uL (ref 3.70–5.45)
RDW: 15.2 % — ABNORMAL HIGH (ref 11.2–14.5)
WBC Count: 5.1 10*3/uL (ref 3.9–10.3)

## 2018-01-02 MED ORDER — SODIUM CHLORIDE 0.9 % IV SOLN
Freq: Once | INTRAVENOUS | Status: AC
  Administered 2018-01-02: 14:00:00 via INTRAVENOUS
  Filled 2018-01-02: qty 5

## 2018-01-02 MED ORDER — PALONOSETRON HCL INJECTION 0.25 MG/5ML
0.2500 mg | Freq: Once | INTRAVENOUS | Status: AC
  Administered 2018-01-02: 0.25 mg via INTRAVENOUS

## 2018-01-02 MED ORDER — SODIUM CHLORIDE 0.9% FLUSH
10.0000 mL | INTRAVENOUS | Status: DC | PRN
  Administered 2018-01-02: 10 mL
  Filled 2018-01-02: qty 10

## 2018-01-02 MED ORDER — SODIUM CHLORIDE 0.9 % IV SOLN
Freq: Once | INTRAVENOUS | Status: AC
  Administered 2018-01-02: 11:00:00 via INTRAVENOUS

## 2018-01-02 MED ORDER — POTASSIUM CHLORIDE 2 MEQ/ML IV SOLN
Freq: Once | INTRAVENOUS | Status: AC
  Administered 2018-01-02: 12:00:00 via INTRAVENOUS
  Filled 2018-01-02: qty 10

## 2018-01-02 MED ORDER — PALONOSETRON HCL INJECTION 0.25 MG/5ML
INTRAVENOUS | Status: AC
  Filled 2018-01-02: qty 5

## 2018-01-02 MED ORDER — HEPARIN SOD (PORK) LOCK FLUSH 100 UNIT/ML IV SOLN
500.0000 [IU] | Freq: Once | INTRAVENOUS | Status: AC | PRN
  Administered 2018-01-02: 500 [IU]
  Filled 2018-01-02: qty 5

## 2018-01-02 MED ORDER — SODIUM CHLORIDE 0.9 % IV SOLN
1000.0000 mg/m2 | Freq: Once | INTRAVENOUS | Status: AC
  Administered 2018-01-02: 1634 mg via INTRAVENOUS
  Filled 2018-01-02: qty 42.98

## 2018-01-02 MED ORDER — CISPLATIN CHEMO INJECTION 100MG/100ML
70.0000 mg/m2 | Freq: Once | INTRAVENOUS | Status: AC
  Administered 2018-01-02: 113 mg via INTRAVENOUS
  Filled 2018-01-02: qty 113

## 2018-01-02 NOTE — Progress Notes (Signed)
Hematology and Oncology Follow Up Visit  Alexis Mcclain [id number] November 14, 1950 67 y.o. 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  PET CT scan in May 2019 showed no evidence of metastatic disease.   Prior Therapy: TURBT on October 26, 2017 with the final pathology showed Infiltrative high-grade urothelial carcinoma with squamous component invading into muscularis propria with lymphovascular invasion identified.    Current therapy: Neoadjuvant chemotherapy utilizing gemcitabine and cisplatin.  Day 1 of cycle 1 started on Dec 12, 2017.  Interim History: Alexis Mcclain presents today for a follow-up visit.  Since last visit, the patient received day 8 of cycle 1 of her chemotherapy.  She tolerated this well overall.  She reported mild nausea and fatigue. She did report some occasional dizziness but no lightheadedness or syncope.  Is able to eat and maintain her food intake at a proper level.  She continues to ambulate without any difficulties.  She denies any hematochezia or melena.  She denies any hematuria or dysuria.  She does not report any headaches, blurry vision, syncope or seizures. Does not report any fevers, chills or sweats.  Does not report any cough, wheezing or hemoptysis.  Does not report any chest pain, palpitation, orthopnea or leg edema.  Does not report any nausea, vomiting or abdominal pain.  Does not report any constipation or diarrhea.  Does not report any skeletal complaints.    Does not report frequency, urgency or hematuria.  Does not report any skin rashes or lesions. Does not report any heat or cold intolerance.  Does not report any lymphadenopathy or petechiae.  Does not report any anxiety or depression.  Remaining review of systems is negative.  The patient is here for evaluation prior to day 1 of cycle  2.   Medications: I have reviewed the patient's current medications.  Current Outpatient Medications  Medication Sig Dispense Refill  . HYDROcodone-acetaminophen (NORCO) 5-325 MG tablet Take 1 tablet by mouth every 4 (four) hours as needed for moderate pain. 20 tablet 0  . lidocaine-prilocaine (EMLA) cream Apply 1 application topically as needed. 30 g 0  . ondansetron (ZOFRAN) 4 MG tablet Take 1 tablet (4 mg total) by mouth daily as needed for nausea or vomiting. 30 tablet 1  . oxybutynin (DITROPAN XL) 10 MG 24 hr tablet Take 1 tablet (10 mg total) by mouth daily. 30 tablet 1  . phenazopyridine (PYRIDIUM) 200 MG tablet Take 1 tablet (200 mg total) by mouth 3 (three) times daily as needed for pain. 30 tablet 0  . prochlorperazine (COMPAZINE) 10 MG tablet Take 1 tablet (10 mg total) by mouth every 6 (six) hours as needed for nausea or vomiting. 30 tablet 1   No current facility-administered medications for this visit.    Facility-Administered Medications Ordered in Other Visits  Medication Dose Route Frequency Provider Last Rate Last Dose  . CISplatin (PLATINOL) 113 mg in sodium chloride 0.9 % 500 mL chemo infusion  70 mg/m2 (Treatment Plan Recorded) Intravenous Once Wyatt Portela, MD      . fosaprepitant (EMEND) 150 mg, dexamethasone (DECADRON) 12 mg in sodium chloride 0.9 % 145 mL IVPB   Intravenous Once Wyatt Portela, MD      . gemcitabine (GEMZAR) 1,634 mg in sodium chloride 0.9 % 100 mL chemo infusion  1,000 mg/m2 (Treatment Plan Recorded) Intravenous Once Wyatt Portela, MD      .  heparin lock flush 100 unit/mL  500 Units Intracatheter Once PRN Wyatt Portela, MD      . palonosetron (ALOXI) injection 0.25 mg  0.25 mg Intravenous Once Shadad, Firas N, MD      . sodium chloride flush (NS) 0.9 % injection 10 mL  10 mL Intracatheter PRN Wyatt Portela, MD         Allergies:  Allergies  Allergen Reactions  . Tape Other (See Comments)    Burns skin    Hurt when pulled off     Past Medical History, Surgical history, Social history, and Family History were reviewed and updated.    Physical Exam: Blood pressure (!) 146/83, pulse 75, temperature 98.6 F (37 C), temperature source Oral, resp. rate 18, height 5\' 2"  (1.575 m), weight 129 lb (58.5 kg), SpO2 98 %.   ECOG: 1 General appearance: alert and cooperative appeared without distress. Head: Normocephalic, without obvious abnormality Oropharynx: No oral thrush or ulcers. Eyes: No scleral icterus.  Pupils are equal and round reactive to light. Lymph nodes: Cervical, supraclavicular, and axillary nodes normal. Heart:regular rate and rhythm, S1, S2 normal, no murmur, click, rub or gallop Lung:chest clear, no wheezing, rales, normal symmetric air entry Abdomin: soft, non-tender, without masses or organomegaly. Neurological: No motor, sensory deficits.  Intact deep tendon reflexes. Skin: No rashes or lesions.  No ecchymosis or petechiae. Musculoskeletal: No joint deformity or effusion. Psychiatric: Mood and affect are appropriate.    Lab Results: Lab Results  Component Value Date   WBC 5.1 01/02/2018   HGB 10.8 (L) 01/02/2018   HCT 34.2 (L) 01/02/2018   MCV 86.6 01/02/2018   PLT 604 (H) 01/02/2018     Chemistry      Component Value Date/Time   NA 142 01/02/2018 0920   K 3.4 (L) 01/02/2018 0920   CL 104 01/02/2018 0920   CO2 28 01/02/2018 0920   BUN 7 01/02/2018 0920   CREATININE 0.81 01/02/2018 0920      Component Value Date/Time   CALCIUM 9.3 01/02/2018 0920   ALKPHOS 114 01/02/2018 0920   AST 13 01/02/2018 0920   ALT 6 01/02/2018 0920   BILITOT <0.2 (L) 01/02/2018 0920       Impression and Plan:  67 year old woman with:  1.    T3N0 high-grade urothelial carcinoma with squamous cell differentiation arising of the bladder diagnosed in March 2019.    She is currently receiving neoadjuvant chemotherapy in preparation for radical cystectomy.  She tolerated her first cycle treatment  well overall with the exception of mild fatigue and mild nausea without vomiting.  Recommend for her to proceed with day 1 of cycle 2 as scheduled today. Plan is to complete 3-4 cycles of therapy prior to proceeding with radical cystectomy.  2.  Renal function surveillance: Her creatinine is normal and will continue to be monitored periodically.  3.  Anemia: She has mild anemia likely related to chemotherapy.  Denies bleeding.  We will continue to monitor on chemotherapy.  4.  IV access: Port-A-Cath inserted without complications.  Will remain in use for the duration of chemotherapy.  5.  Antiemetics: Nausea is well managed at this time with Compazine.  No vomiting.  6.  Dyspnea on exertion: She reports no dyspnea on exertion today.  She will need cardiac evaluation prior to proceeding with surgery.  7.  Prognosis: The goal of therapy is curative and her performance status is adequate to warrant aggressive therapy.  8.  Follow-up: Will be  in the next 3 weeks to start cycle 3 of chemotherapy.   Mikey Bussing, DNP, AGPCNP-BC, AOCNP 6/5/201912:51 PM

## 2018-01-02 NOTE — Patient Instructions (Signed)
[facility] Discharge Instructions for Patients Receiving Chemotherapy  Today you received the following chemotherapy agents Gemzar, Cisplatin  To help prevent nausea and vomiting after your treatment, we encourage you to take your nausea medication as directed   If you develop nausea and vomiting that is not controlled by your nausea medication, call the clinic.   BELOW ARE SYMPTOMS THAT SHOULD BE REPORTED IMMEDIATELY:  *FEVER GREATER THAN 100.5 F  *CHILLS WITH OR WITHOUT FEVER  NAUSEA AND VOMITING THAT IS NOT CONTROLLED WITH YOUR NAUSEA MEDICATION  *UNUSUAL SHORTNESS OF BREATH  *UNUSUAL BRUISING OR BLEEDING  TENDERNESS IN MOUTH AND THROAT WITH OR WITHOUT PRESENCE OF ULCERS  *URINARY PROBLEMS  *BOWEL PROBLEMS  UNUSUAL RASH Items with * indicate a potential emergency and should be followed up as soon as possible.  Feel free to call the clinic should you have any questions or concerns. The clinic phone number is [phone].  Please show the CHEMO ALERT CARD at check-in to the Emergency Department and triage nurse.   

## 2018-01-02 NOTE — Patient Instructions (Signed)
Implanted Port Home Guide An implanted port is a type of central line that is placed under the skin. Central lines are used to provide IV access when treatment or nutrition needs to be given through a person's veins. Implanted ports are used for long-term IV access. An implanted port may be placed because:  You need IV medicine that would be irritating to the small veins in your hands or arms.  You need long-term IV medicines, such as antibiotics.  You need IV nutrition for a long period.  You need frequent blood draws for lab tests.  You need dialysis.  Implanted ports are usually placed in the chest area, but they can also be placed in the upper arm, the abdomen, or the leg. An implanted port has two main parts:  Reservoir. The reservoir is round and will appear as a small, raised area under your skin. The reservoir is the part where a needle is inserted to give medicines or draw blood.  Catheter. The catheter is a thin, flexible tube that extends from the reservoir. The catheter is placed into a large vein. Medicine that is inserted into the reservoir goes into the catheter and then into the vein.  How will I care for my incision site? Do not get the incision site wet. Bathe or shower as directed by your health care provider. How is my port accessed? Special steps must be taken to access the port:  Before the port is accessed, a numbing cream can be placed on the skin. This helps numb the skin over the port site.  Your health care provider uses a sterile technique to access the port. ? Your health care provider must put on a mask and sterile gloves. ? The skin over your port is cleaned carefully with an antiseptic and allowed to dry. ? The port is gently pinched between sterile gloves, and a needle is inserted into the port.  Only "non-coring" port needles should be used to access the port. Once the port is accessed, a blood return should be checked. This helps ensure that the port  is in the vein and is not clogged.  If your port needs to remain accessed for a constant infusion, a clear (transparent) bandage will be placed over the needle site. The bandage and needle will need to be changed every week, or as directed by your health care provider.  Keep the bandage covering the needle clean and dry. Do not get it wet. Follow your health care provider's instructions on how to take a shower or bath while the port is accessed.  If your port does not need to stay accessed, no bandage is needed over the port.  What is flushing? Flushing helps keep the port from getting clogged. Follow your health care provider's instructions on how and when to flush the port. Ports are usually flushed with saline solution or a medicine called heparin. The need for flushing will depend on how the port is used.  If the port is used for intermittent medicines or blood draws, the port will need to be flushed: ? After medicines have been given. ? After blood has been drawn. ? As part of routine maintenance.  If a constant infusion is running, the port may not need to be flushed.  How long will my port stay implanted? The port can stay in for as long as your health care provider thinks it is needed. When it is time for the port to come out, surgery will be   done to remove it. The procedure is similar to the one performed when the port was put in. When should I seek immediate medical care? When you have an implanted port, you should seek immediate medical care if:  You notice a bad smell coming from the incision site.  You have swelling, redness, or drainage at the incision site.  You have more swelling or pain at the port site or the surrounding area.  You have a fever that is not controlled with medicine.  This information is not intended to replace advice given to you by your health care provider. Make sure you discuss any questions you have with your health care provider. Document  Released: 07/17/2005 Document Revised: 12/23/2015 Document Reviewed: 03/24/2013 Elsevier Interactive Patient Education  2017 [employer]  

## 2018-01-09 ENCOUNTER — Inpatient Hospital Stay: Payer: Medicare Other

## 2018-01-21 ENCOUNTER — Telehealth: Payer: Self-pay | Admitting: *Deleted

## 2018-01-21 NOTE — Telephone Encounter (Signed)
Received voice mail message from patient stating,"please cancel my June 26th and July 3rd appointments. My phone number is [phone]." LOS sent to scheduling.

## 2018-01-23 ENCOUNTER — Inpatient Hospital Stay: Payer: Medicare Other

## 2018-01-23 ENCOUNTER — Inpatient Hospital Stay: Payer: Medicare Other | Admitting: Oncology

## 2018-01-30 ENCOUNTER — Other Ambulatory Visit: Payer: Medicare Other

## 2018-01-30 ENCOUNTER — Ambulatory Visit: Payer: BLUE CROSS/BLUE SHIELD

## 2018-01-30 ENCOUNTER — Other Ambulatory Visit: Payer: BLUE CROSS/BLUE SHIELD

## 2018-02-08 DIAGNOSIS — C672 Malignant neoplasm of lateral wall of bladder: Secondary | ICD-10-CM | POA: Diagnosis not present

## 2018-02-08 DIAGNOSIS — D3500 Benign neoplasm of unspecified adrenal gland: Secondary | ICD-10-CM | POA: Diagnosis not present

## 2018-02-08 DIAGNOSIS — K5730 Diverticulosis of large intestine without perforation or abscess without bleeding: Secondary | ICD-10-CM | POA: Diagnosis not present

## 2018-02-11 DIAGNOSIS — C672 Malignant neoplasm of lateral wall of bladder: Secondary | ICD-10-CM | POA: Diagnosis not present

## 2018-02-11 DIAGNOSIS — D3500 Benign neoplasm of unspecified adrenal gland: Secondary | ICD-10-CM | POA: Diagnosis not present

## 2018-03-19 DIAGNOSIS — C672 Malignant neoplasm of lateral wall of bladder: Secondary | ICD-10-CM | POA: Diagnosis not present

## 2018-03-25 ENCOUNTER — Telehealth: Payer: Self-pay | Admitting: Oncology

## 2018-03-25 NOTE — Telephone Encounter (Signed)
Scheduled appt per 8/23 sch message - pt aware of appt date and time.

## 2018-03-26 ENCOUNTER — Inpatient Hospital Stay: Payer: Medicare Other | Attending: Oncology | Admitting: Oncology

## 2018-03-26 VITALS — BP 134/71 | HR 77 | Temp 98.7°F | Resp 17 | Ht 62.0 in | Wt 130.2 lb

## 2018-03-26 DIAGNOSIS — Z79899 Other long term (current) drug therapy: Secondary | ICD-10-CM | POA: Diagnosis not present

## 2018-03-26 DIAGNOSIS — Z9221 Personal history of antineoplastic chemotherapy: Secondary | ICD-10-CM | POA: Insufficient documentation

## 2018-03-26 DIAGNOSIS — C679 Malignant neoplasm of bladder, unspecified: Secondary | ICD-10-CM | POA: Diagnosis not present

## 2018-03-26 DIAGNOSIS — D494 Neoplasm of unspecified behavior of bladder: Secondary | ICD-10-CM

## 2018-03-26 NOTE — Progress Notes (Signed)
Hematology and Oncology Follow Up Visit  Alexis Mcclain [id number] 1950-12-11 67 y.o. 03/26/2018 3:33 PM Patient, No Pcp PerWinter, Alexis Mcclain*   Principle Diagnosis: 67 year old woman with bladder cancer diagnosed in March 2019.  She presented with T3N0 high-grade urothelial carcinoma with squamous differentiation without any evidence of metastatic disease.   Prior Therapy: TURBT on October 26, 2017 with the final pathology showed Infiltrative high-grade urothelial carcinoma with squamous component invading into muscularis propria with lymphovascular invasion identified.   Neoadjuvant chemotherapy utilizing gemcitabine and cisplatin.  Day 1 of cycle 1 started on Dec 12, 2017.  He received day 1 of cycle 2 on 01/02/2018 and no further chemotherapy after that.  Current therapy: Active surveillance  Interim History: Alexis Mcclain is here for a follow-up.  Since the last visit, he decided to stop chemotherapy because of toxicities.  He received cycle 1 and day 1 of cycle 2 and decided to discontinue therapy.  He had a follow-up as CT scan in July 2019 which did not show any evidence of metastatic disease with decrease in her bladder tumor.  She has been evaluated for radical cystectomy that she is still hesitant to undergo such radical therapy.  Clinically she reports no complaints at this time.  She denies any abdominal discomfort, hematochezia or hematuria.  Her appetite is improving and her performance status is reasonable.  She does not report any headaches, blurry vision, syncope or seizures.  He denies any dizziness but does report hearing difficulties at times.  Does not report any fevers, chills or sweats.  Does not report any cough, wheezing or hemoptysis.  Does not report any chest pain, palpitation, orthopnea or leg edema.  Does not report any nausea, vomiting or abdominal pain.  Does not report any changes in bowel habits. Does not report any arthralgias or myalgias.    Does not report  frequency, urgency or hematuria.  Does not report any skin rashes or lesions.  Does not report any bleeding or clotting tendencies.  She does report some anxiety no depression.  Remaining review of systems is negative.    Medications: I have reviewed the patient's current medications.  Current Outpatient Medications  Medication Sig Dispense Refill  . HYDROcodone-acetaminophen (NORCO) 5-325 MG tablet Take 1 tablet by mouth every 4 (four) hours as needed for moderate pain. 20 tablet 0  . lidocaine-prilocaine (EMLA) cream Apply 1 application topically as needed. 30 g 0  . ondansetron (ZOFRAN) 4 MG tablet Take 1 tablet (4 mg total) by mouth daily as needed for nausea or vomiting. 30 tablet 1  . oxybutynin (DITROPAN XL) 10 MG 24 hr tablet Take 1 tablet (10 mg total) by mouth daily. 30 tablet 1  . phenazopyridine (PYRIDIUM) 200 MG tablet Take 1 tablet (200 mg total) by mouth 3 (three) times daily as needed for pain. 30 tablet 0  . prochlorperazine (COMPAZINE) 10 MG tablet Take 1 tablet (10 mg total) by mouth every 6 (six) hours as needed for nausea or vomiting. 30 tablet 1   No current facility-administered medications for this visit.      Allergies:  Allergies  Allergen Reactions  . Tape Other (See Comments)    Burns skin    Hurt when pulled off    Past Medical History, Surgical history, Social history, and Family History were reviewed and updated.    Physical Exam:  Blood pressure 134/71, pulse 77, temperature 98.7 F (37.1 C), temperature source Oral, resp. rate 17, height 5\' 2"  (1.575 m), weight  130 lb 3.2 oz (59.1 kg), SpO2 100 %.  ECOG 1 General appearance: Comfortable appearing without any discomfort Head: Normocephalic without any trauma Oropharynx: Mucous membranes are moist and pink without any thrush or ulcers. Eyes: Pupils are equal and round reactive to light. Lymph nodes: No cervical, supraclavicular, inguinal or axillary lymphadenopathy.   Heart:regular rate and  rhythm.  S1 and S2 without leg edema. Lung: Clear without any rhonchi or wheezes.  No dullness to percussion. Abdomin: Soft, nontender, nondistended with good bowel sounds.  No hepatosplenomegaly. Musculoskeletal: No joint deformity or effusion.  Full range of motion noted. Neurological: No deficits noted on motor, sensory and deep tendon reflex exam. Skin: No petechial rash or dryness.  Appeared moist.  Psychiatric: Appears slightly anxious.       Lab Results: Lab Results  Component Value Date   WBC 5.1 01/02/2018   HGB 10.8 (L) 01/02/2018   HCT 34.2 (L) 01/02/2018   MCV 86.6 01/02/2018   PLT 604 (H) 01/02/2018     Chemistry      Component Value Date/Time   NA 142 01/02/2018 0920   K 3.4 (L) 01/02/2018 0920   CL 104 01/02/2018 0920   CO2 28 01/02/2018 0920   BUN 7 01/02/2018 0920   CREATININE 0.81 01/02/2018 0920      Component Value Date/Time   CALCIUM 9.3 01/02/2018 0920   ALKPHOS 114 01/02/2018 0920   AST 13 01/02/2018 0920   ALT 6 01/02/2018 0920   BILITOT <0.2 (L) 01/02/2018 0920       Impression and Plan:  67 year old woman with:  1.    Bladder cancer diagnosed in March 2019.  She presented with T3N0 high-grade urothelial carcinoma with squamous cell differentiation without any evidence of metastatic disease.   She is status post cycle 1 of chemotherapy utilizing cisplatin and gemcitabine and day 1 of cycle 2 only.  She has not received any chemotherapy since June 2019.  The natural course of this disease was reviewed today as well as treatment options were discussed in detail.  She has positive response to therapy based on imaging studies in July 2019 and no evidence of disease relapse.  She understands her best curative option is radical cystectomy although she is still quite apprehensive about this procedure.  He is worried about complications and recovery as well as risk of relapse.  She inquired about immunotherapy, oral targeted therapy as well as  alternative therapy which was addressed today with her in detail.  Immunotherapy has been approved for the treatment of bladder cancer if she has PDL 1 overexpression which we will check on her original specimen.  Immunotherapy will be a reasonable option if she has metastatic disease or locally advanced disease and it does not substitute for definitive local therapy.  I also advised against using alternative therapy as well inevitably result in to disease progression if she does not have a radical cystectomy done.  After discussion today, she is still not sure whether she wants to proceed with radical cystectomy.  He is interested to talking to other patients that had a radical cystectomies I will make arrangement to help her with diet.  I urged her to consider this option seriously as gives her the best option for cure.  Immunotherapy will be used in the future if she develops locally advanced disease and if her PDL 1 status is positive which we will check.  2.  Follow-up: We will be determined based on her wishes how to proceed.  She will think about his options and let me know in the future.  30  minutes was spent with the patient face-to-face today.  More than 50% of time was dedicated to discussing the natural course of this disease, treatment options as well as coordinating her future plan of care.    Zola Button, MD 8/27/20193:33 PM

## 2018-03-27 ENCOUNTER — Other Ambulatory Visit (HOSPITAL_COMMUNITY)
Admission: RE | Admit: 2018-03-27 | Discharge: 2018-03-27 | Disposition: A | Discharge status: Home / Self Care | Payer: Medicare Other | Source: Ambulatory Visit | Attending: Oncology | Admitting: Oncology

## 2018-03-27 ENCOUNTER — Telehealth: Payer: Self-pay

## 2018-03-27 DIAGNOSIS — D3500 Benign neoplasm of unspecified adrenal gland: Secondary | ICD-10-CM | POA: Insufficient documentation

## 2018-03-27 DIAGNOSIS — C672 Malignant neoplasm of lateral wall of bladder: Secondary | ICD-10-CM | POA: Diagnosis not present

## 2018-03-27 DIAGNOSIS — R0602 Shortness of breath: Secondary | ICD-10-CM | POA: Diagnosis not present

## 2018-03-27 NOTE — Telephone Encounter (Signed)
Per 8/28 los

## 2018-05-23 IMAGING — XA IR FLUORO GUIDE CV LINE*R*
1 series · 1 of 1 positions shown · non-contrast
Comparison: none

CLINICAL DATA: Bladder carcinoma

[Series 300: line placements · 1 of 1 slices shown]
[im 1/1]
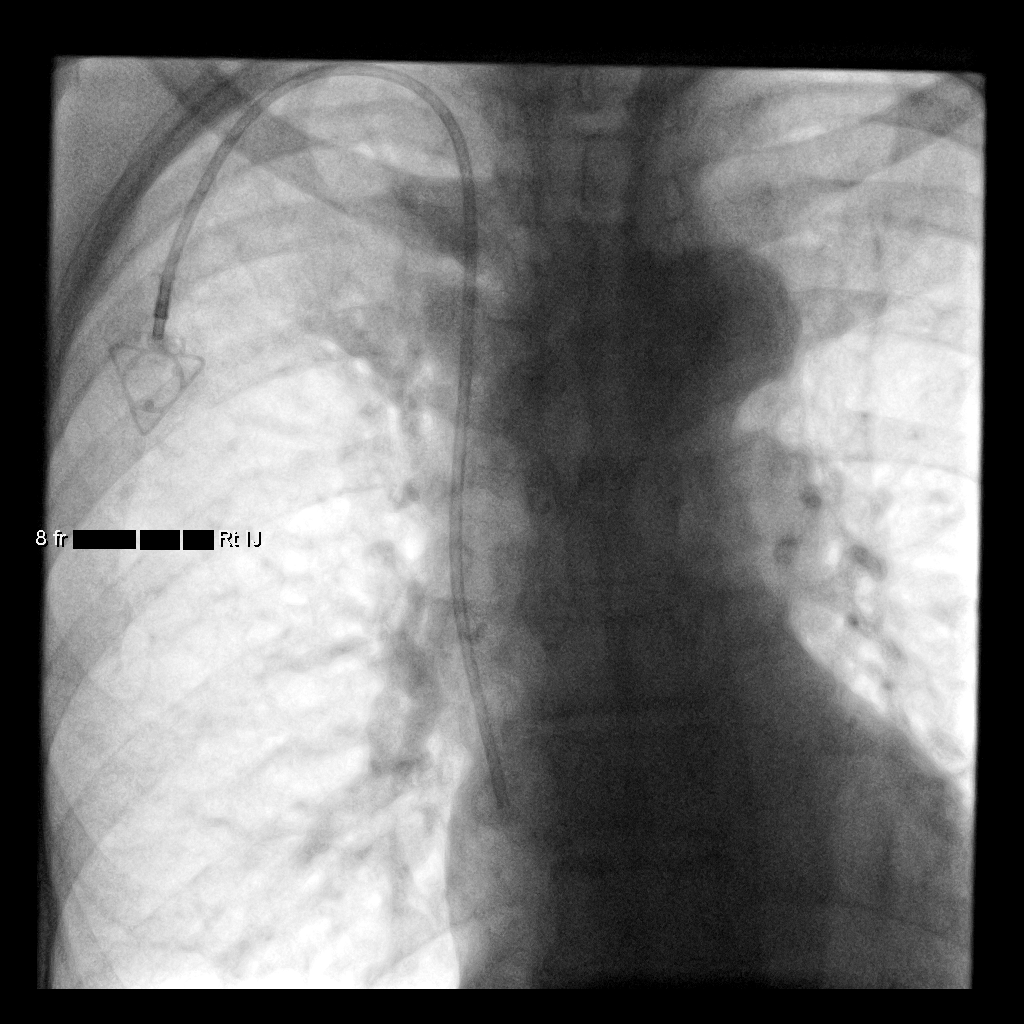

[1 of 1 positions shown; findings below may reference images not displayed]

EXAM:
RIGHT INTERNAL JUGULAR SINGLE LUMEN POWER PORT CATHETER INSERTION

Radiologist:  Tiger, Chai

Guidance:  Ultrasound and fluoroscopic

MEDICATIONS:
Ancef 2 g; The antibiotic was administered within an appropriate
time interval prior to skin puncture.

ANESTHESIA/SEDATION:
Versed 2.0 mg IV; Fentanyl 100 mcg IV;

Moderate Sedation Time:  27 minutes

The patient was continuously monitored during the procedure by the
interventional radiology nurse under my direct supervision.

FLUOROSCOPY TIME:  30 seconds (3 mGy)

COMPLICATIONS:
None immediate.

CONTRAST:  None.

PROCEDURE:
Informed consent was obtained from the patient following explanation
of the procedure, risks, benefits and alternatives. The patient
understands, agrees and consents for the procedure. All questions
were addressed. A time out was performed.

Maximal barrier sterile technique utilized including caps, mask,
sterile gowns, sterile gloves, large sterile drape, hand hygiene,
and 2% chlorhexidine scrub.

Under sterile conditions and local anesthesia, right internal
jugular micropuncture venous access was performed. Access was
performed with ultrasound. Images were obtained for documentation. A
guide wire was inserted followed by a transitional dilator. This
allowed insertion of a guide wire and catheter into the IVC.
Measurements were obtained from the SVC / RA junction back to the
right IJ venotomy site. In the right infraclavicular chest, a
subcutaneous pocket was created over the second anterior rib. This
was done under sterile conditions and local anesthesia. 1% lidocaine
with epinephrine was utilized for this. A 2.5 cm incision was made
in the skin. Blunt dissection was performed to create a subcutaneous
pocket over the right pectoralis major muscle. The pocket was
flushed with saline vigorously. There was adequate hemostasis. The
port catheter was assembled and checked for leakage. The port
catheter was secured in the pocket with two retention sutures. The
tubing was tunneled subcutaneously to the right venotomy site and
inserted into the SVC/RA junction through a valved peel-away sheath.
Position was confirmed with fluoroscopy. Images were obtained for
documentation. The patient tolerated the procedure well. No
immediate complications. Incisions were closed in a two layer
fashion with 4 - 0 Vicryl suture. Dermabond was applied to the skin.
The port catheter was accessed, blood was aspirated followed by
saline and heparin flushes. Needle was removed. A dry sterile
dressing was applied.
IMPRESSION: Ultrasound and fluoroscopically guided right internal jugular single
lumen power port catheter insertion. Tip in the SVC/RA junction.
Catheter ready for use.

## 2018-05-24 DIAGNOSIS — Z23 Encounter for immunization: Secondary | ICD-10-CM | POA: Diagnosis not present

## 2018-06-25 DIAGNOSIS — C672 Malignant neoplasm of lateral wall of bladder: Secondary | ICD-10-CM | POA: Diagnosis not present

## 2020-01-16 ENCOUNTER — Other Ambulatory Visit: Payer: Self-pay | Admitting: Pharmacist

## 2023-03-01 DIAGNOSIS — E782 Mixed hyperlipidemia: Secondary | ICD-10-CM | POA: Insufficient documentation

## 2023-03-01 NOTE — Progress Notes (Signed)
 [facility] [place]  Return Visit Alexis Mcclain DOB: [birth year]  MRN: [id number] Visit Date: 03/01/2023  Encounter Provider: Elberta Ebb Cone, NP  HPI HEALTH MAINTENANCE: Alexis CARRERAS presents for a health maintenance exam. SCREENING TESTS: ---Last physical exam: 02/22/2022 ---Last breast exam: unknown ---She does perform monthly self breast exams. ---Last colonoscopy: more than 10 years ago ---Last PAP smear: unknown ---Last mammogram: 2009 ---Last DEXA scan: unknown LIFESTYLE:  Sleep hours: adequate Seatbelt use: always LAB/IMMUNIZATIONS: ---Last lipid profile: 01/2022, hyperlipidemia ---Last tetanus vaccine: unknown ---Last influenza vaccine: annually ---Last pneumococcal vaccine: prevnar 13 06/10/2019, prevnar 20 02/22/2022 ---Shingrix vaccine: no prior ---Hep C antibody: 02/22/2022, negative MEDICATIONS: OTC Vitamins: vitamin d Other OTC Meds: denies  Other issues discussed today:  Patient continues to have bilateral lower extremity edema, improved with daily lasix 20 mg and compression socks. Last year had workup for edema and SOB. BNP WNL. Ct lung baseline showed emphysema. She has hyperlipidemia but no atherosclerosis or calcification on cardiac ct.  Shortness of breath is stable at rest. She is able to lie flat to sleep. Leg swelling improves with elevation and compression socks. Has dry, intermittent cough at baseline. Denies chest pain, dizziness, weakness. Everyday smoker of 2 ppd for more than 30 years. Hx of bladder tumor. PET scan in 2019. CT lung 01/2022.   She reports 2-3 weeks of productive cough with tan/yellow sputum. Typically has dry cough at baseline. Started with runny nose, sore throat and congestion. These symptoms have resolved. She has tried delsym and mucinex.   ROS-  Constitutional symptoms: negative Eyes:  negative Ear, nose, throat:  negative Cardiovascular:  negative Respiratory:  shortness of breath and  cough Gastrointestinal:  negative Genitourinary:  negative Skin:  negative Neurological:  negative Musculoskeletal:  negative Psychiatric:  negative Endocrine:  negative Hematological:  negative Allergic:  negative   Medical History:  Past Medical History:  Diagnosis Date   Bladder cancer (CMS/HCC) 2019   Bladder tumor 10/26/2017   Port-A-Cath in place 12/12/2017    Patient Active Problem List   Diagnosis Date Noted   Hx of bladder cancer 03/01/2023   Localized edema 03/01/2023   Hyperlipidemia, mixed 03/01/2023   Centrilobular emphysema (HCC) 03/08/2022   Current Medications:  Current Outpatient Medications on File Prior to Visit  Medication Sig Dispense Refill   furosemide (LASIX) 20 mg tablet TAKE 1 TABLET(20 MG) BY MOUTH DAILY 30 tablet 0   No current facility-administered medications on file prior to visit.    Current Outpatient Medications:    furosemide (LASIX) 20 mg tablet, TAKE 1 TABLET(20 MG) BY MOUTH DAILY, Disp: 30 tablet, Rfl: 0   benzonatate  (TESSALON ) 100 mg capsule, Take 1 capsule (100 mg total) by mouth 3 (three) times a day as needed for cough for up to 7 days., Disp: 20 capsule, Rfl: 0   doxycycline (VIBRAMYCIN) 100 mg capsule, Take 1 capsule (100 mg total) by mouth 2 (two) times a day for 7 days. Take with 8 oz water. Do not lie down for at least 30 minutes after., Disp: 14 capsule, Rfl: 0  Allergies:  Allergies  Allergen Reactions   Nickel     Whelps   Adhesive Other (See Comments)    Burns skin    Hurt when pulled off     Immunizations:  Immunization History  Administered Date(s) Administered   Influenza, High-dose Seasonal, Quadrivalent, Preservative Free 06/10/2019, 07/11/2021   Janssen Sars-CoV-2 Vaccination 03/30/2020   Pneumococcal Conjugate 13-Valent 06/10/2019   Pneumococcal Conjugate  20-Valent (PCV20) 02/22/2022    Surgical History-  Past Surgical History:  Procedure Laterality Date   HYSTERECTOMY       Procedure: HYSTERECTOMY   TUBAL LIGATION     Procedure: TUBAL LIGATION   WISDOM TOOTH EXTRACTION     Procedure: WISDOM TOOTH EXTRACTION    Family history-  Family History  Problem Relation Name Age of Onset   Breast cancer Mother     Hypertension Father     Heart attack Father     Social history-  Social History   Socioeconomic History   Marital status: Divorced    Spouse name: None   Number of children: None   Years of education: None   Highest education level: None  Occupational History   None  Tobacco Use   Smoking status: Every Day    Current packs/day: 2.00    Average packs/day: 2.0 packs/day for 50.6 years (101.2 ttl pk-yrs)    Types: Cigarettes    Start date: 07/31/1972   Smokeless tobacco: Never   Tobacco comments:    Per 2023 LS form/chart(2)  Substance and Sexual Activity   Alcohol use: No   Drug use: No   Sexual activity: None  Other Topics Concern   None  Social History Narrative   None   Social Determinants of Health   Food Insecurity: Not on file  Transportation Needs: Not on file  Safety: Not on file  Living Situation: Not on file     Physical Exam: BP 132/76   Pulse 79   Temp 98.6 F (37 C)   Resp 16   Ht 1.575 m (5' 2)   Wt 68.4 kg (150 lb 12.8 oz)   SpO2 96%   BMI 27.58 kg/m  Wt Readings from Last 3 Encounters:  03/01/23 68.4 kg (150 lb 12.8 oz)  02/22/22 73.7 kg (162 lb 6.4 oz)   Physical Exam Vitals and nursing note reviewed.  Constitutional:      General: She is not in acute distress.    Appearance: Normal appearance. She is normal weight. She is not ill-appearing, toxic-appearing or diaphoretic.  HENT:     Head: Normocephalic and atraumatic.     Right Ear: Tympanic membrane, ear canal and external ear normal.     Left Ear: Tympanic membrane, ear canal and external ear normal.     Nose: Nose normal.     Mouth/Throat:     Mouth: Mucous membranes are moist.     Pharynx: Oropharynx is clear.  Eyes:      Extraocular Movements: Extraocular movements intact.     Conjunctiva/sclera: Conjunctivae normal.     Pupils: Pupils are equal, round, and reactive to light.  Neck:     Vascular: No carotid bruit.  Cardiovascular:     Rate and Rhythm: Normal rate and regular rhythm.     Pulses: Normal pulses.     Heart sounds: Normal heart sounds.  Pulmonary:     Effort: Pulmonary effort is normal.     Breath sounds: Examination of the right-middle field reveals decreased breath sounds. Examination of the left-middle field reveals decreased breath sounds. Examination of the right-lower field reveals decreased breath sounds. Examination of the left-lower field reveals decreased breath sounds. Decreased breath sounds present.  Abdominal:     General: Abdomen is flat. Bowel sounds are normal. There is no distension.     Palpations: Abdomen is soft. There is no mass.     Tenderness: There is no abdominal tenderness. There is  no guarding or rebound.     Hernia: No hernia is present.  Musculoskeletal:        General: No swelling, tenderness, deformity or signs of injury. Normal range of motion.     Cervical back: Normal range of motion and neck supple. No rigidity or tenderness.     Right lower leg: No edema.     Left lower leg: No edema.  Lymphadenopathy:     Cervical: No cervical adenopathy.  Skin:    General: Skin is warm and dry.     Capillary Refill: Capillary refill takes less than 2 seconds.     Coloration: Skin is not jaundiced or pale.     Findings: No bruising, erythema, lesion or rash.  Neurological:     General: No focal deficit present.     Mental Status: She is alert and oriented to person, place, and time. Mental status is at baseline.     Cranial Nerves: No cranial nerve deficit.     Sensory: No sensory deficit.     Motor: No weakness.     Coordination: Coordination normal.     Gait: Gait normal.     Deep Tendon Reflexes: Reflexes normal.  Psychiatric:        Mood and Affect: Mood  normal.        Behavior: Behavior normal.        Thought Content: Thought content normal.        Judgment: Judgment normal.    Assessment and Plan:  1. Encounter for Medicare annual wellness exam  2. Annual physical exam -  reviewed recommendations for heart healthy diet and routine exercise with goal of 150 min/week.  - immunizations reviewed and counseled - declines to update breast and colon cancer screening - discussed repeat ct lung for annual screening, defers for now - Lipid Panel - CBC with Differential - Comprehensive Metabolic Panel  3. Hyperlipidemia, mixed -  reviewed recommendations for heart healthy diet and routine exercise with goal of 150 min/week.  - discussed absence of coronary artery calcification on recent CT - Lipid Panel  4. Localized edema Stable with use of lasix.   5. Centrilobular emphysema (HCC) Acute exacerbation, treat with doxycycline.  - doxycycline (VIBRAMYCIN) 100 mg capsule; Take 1 capsule (100 mg total) by mouth 2 (two) times a day for 7 days. Take with 8 oz water. Do not lie down for at least 30 minutes after.  Dispense: 14 capsule; Refill: 0 - benzonatate  (TESSALON ) 100 mg capsule; Take 1 capsule (100 mg total) by mouth 3 (three) times a day as needed for cough for up to 7 days.  Dispense: 20 capsule; Refill: 0  Return in 1 year (on 03/01/2024) for Medicare Annual Wellness Visit .  I have personally spent 30 minutes involved in face-to-face and non-face-to-face activities for this patient on the day of the visit outside of CPE/AWV activities.  Professional time spent includes the following activities, in addition to those noted in the documentation: Review of outside and prior records including labs, imaging, office notes, counseling of patient regarding nonpharmacologic and pharmacologic management of current conditions, diagnostic testing, differential diagnosis, goals of therapy and documentation.  Electronically signed by: Elberta Ebb Cone, NP 03/01/2023 11:10 AM

## 2024-07-02 DIAGNOSIS — I10 Essential (primary) hypertension: Secondary | ICD-10-CM | POA: Insufficient documentation

## 2024-07-02 DIAGNOSIS — R7303 Prediabetes: Secondary | ICD-10-CM | POA: Insufficient documentation

## 2024-08-19 ENCOUNTER — Inpatient Hospital Stay (HOSPITAL_COMMUNITY)
Admission: EM | Admit: 2024-08-19 | Discharge: 2024-09-02 | DRG: 299 | Disposition: A | Discharge status: Home / Self Care

## 2024-08-19 ENCOUNTER — Inpatient Hospital Stay (HOSPITAL_COMMUNITY)

## 2024-08-19 ENCOUNTER — Encounter (HOSPITAL_COMMUNITY): Payer: Self-pay

## 2024-08-19 ENCOUNTER — Emergency Department (HOSPITAL_COMMUNITY)

## 2024-08-19 ENCOUNTER — Encounter: Payer: Self-pay | Admitting: Oncology

## 2024-08-19 ENCOUNTER — Other Ambulatory Visit: Payer: Self-pay

## 2024-08-19 DIAGNOSIS — J9811 Atelectasis: Secondary | ICD-10-CM | POA: Diagnosis not present

## 2024-08-19 DIAGNOSIS — J439 Emphysema, unspecified: Secondary | ICD-10-CM | POA: Diagnosis present

## 2024-08-19 DIAGNOSIS — J9621 Acute and chronic respiratory failure with hypoxia: Secondary | ICD-10-CM | POA: Diagnosis not present

## 2024-08-19 DIAGNOSIS — G9341 Metabolic encephalopathy: Secondary | ICD-10-CM

## 2024-08-19 DIAGNOSIS — G9340 Encephalopathy, unspecified: Secondary | ICD-10-CM | POA: Diagnosis not present

## 2024-08-19 DIAGNOSIS — I10 Essential (primary) hypertension: Secondary | ICD-10-CM | POA: Diagnosis present

## 2024-08-19 DIAGNOSIS — K5900 Constipation, unspecified: Secondary | ICD-10-CM | POA: Diagnosis not present

## 2024-08-19 DIAGNOSIS — J9601 Acute respiratory failure with hypoxia: Secondary | ICD-10-CM | POA: Diagnosis not present

## 2024-08-19 DIAGNOSIS — R5381 Other malaise: Secondary | ICD-10-CM | POA: Diagnosis not present

## 2024-08-19 DIAGNOSIS — K567 Ileus, unspecified: Secondary | ICD-10-CM | POA: Diagnosis not present

## 2024-08-19 DIAGNOSIS — J90 Pleural effusion, not elsewhere classified: Secondary | ICD-10-CM | POA: Diagnosis present

## 2024-08-19 DIAGNOSIS — N289 Disorder of kidney and ureter, unspecified: Secondary | ICD-10-CM | POA: Diagnosis present

## 2024-08-19 DIAGNOSIS — Z7409 Other reduced mobility: Secondary | ICD-10-CM | POA: Diagnosis not present

## 2024-08-19 DIAGNOSIS — D72829 Elevated white blood cell count, unspecified: Secondary | ICD-10-CM | POA: Diagnosis not present

## 2024-08-19 DIAGNOSIS — R001 Bradycardia, unspecified: Secondary | ICD-10-CM | POA: Diagnosis not present

## 2024-08-19 DIAGNOSIS — N179 Acute kidney failure, unspecified: Secondary | ICD-10-CM | POA: Diagnosis not present

## 2024-08-19 DIAGNOSIS — F419 Anxiety disorder, unspecified: Secondary | ICD-10-CM | POA: Diagnosis present

## 2024-08-19 DIAGNOSIS — M549 Dorsalgia, unspecified: Secondary | ICD-10-CM | POA: Diagnosis present

## 2024-08-19 DIAGNOSIS — Z9109 Other allergy status, other than to drugs and biological substances: Secondary | ICD-10-CM

## 2024-08-19 DIAGNOSIS — D649 Anemia, unspecified: Secondary | ICD-10-CM | POA: Diagnosis present

## 2024-08-19 DIAGNOSIS — I7101 Dissection of ascending aorta: Secondary | ICD-10-CM

## 2024-08-19 DIAGNOSIS — F05 Delirium due to known physiological condition: Secondary | ICD-10-CM | POA: Diagnosis not present

## 2024-08-19 DIAGNOSIS — I161 Hypertensive emergency: Secondary | ICD-10-CM | POA: Diagnosis not present

## 2024-08-19 DIAGNOSIS — Z716 Tobacco abuse counseling: Secondary | ICD-10-CM

## 2024-08-19 DIAGNOSIS — R739 Hyperglycemia, unspecified: Secondary | ICD-10-CM | POA: Diagnosis not present

## 2024-08-19 DIAGNOSIS — K56609 Unspecified intestinal obstruction, unspecified as to partial versus complete obstruction: Secondary | ICD-10-CM

## 2024-08-19 DIAGNOSIS — J9622 Acute and chronic respiratory failure with hypercapnia: Secondary | ICD-10-CM | POA: Diagnosis not present

## 2024-08-19 DIAGNOSIS — R911 Solitary pulmonary nodule: Secondary | ICD-10-CM | POA: Diagnosis present

## 2024-08-19 DIAGNOSIS — Z8551 Personal history of malignant neoplasm of bladder: Secondary | ICD-10-CM

## 2024-08-19 DIAGNOSIS — E8770 Fluid overload, unspecified: Secondary | ICD-10-CM | POA: Diagnosis not present

## 2024-08-19 DIAGNOSIS — R1110 Vomiting, unspecified: Secondary | ICD-10-CM | POA: Diagnosis not present

## 2024-08-19 DIAGNOSIS — I7100 Dissection of unspecified site of aorta: Principal | ICD-10-CM | POA: Diagnosis present

## 2024-08-19 DIAGNOSIS — F17210 Nicotine dependence, cigarettes, uncomplicated: Secondary | ICD-10-CM | POA: Diagnosis present

## 2024-08-19 DIAGNOSIS — K760 Fatty (change of) liver, not elsewhere classified: Secondary | ICD-10-CM | POA: Diagnosis not present

## 2024-08-19 DIAGNOSIS — J810 Acute pulmonary edema: Secondary | ICD-10-CM | POA: Diagnosis not present

## 2024-08-19 DIAGNOSIS — Z720 Tobacco use: Secondary | ICD-10-CM | POA: Diagnosis not present

## 2024-08-19 DIAGNOSIS — I71012 Dissection of descending thoracic aorta: Secondary | ICD-10-CM | POA: Diagnosis not present

## 2024-08-19 DIAGNOSIS — E878 Other disorders of electrolyte and fluid balance, not elsewhere classified: Secondary | ICD-10-CM | POA: Diagnosis not present

## 2024-08-19 DIAGNOSIS — E278 Other specified disorders of adrenal gland: Secondary | ICD-10-CM | POA: Diagnosis present

## 2024-08-19 DIAGNOSIS — R410 Disorientation, unspecified: Secondary | ICD-10-CM | POA: Diagnosis not present

## 2024-08-19 DIAGNOSIS — Z90710 Acquired absence of both cervix and uterus: Secondary | ICD-10-CM

## 2024-08-19 DIAGNOSIS — E162 Hypoglycemia, unspecified: Secondary | ICD-10-CM | POA: Diagnosis not present

## 2024-08-19 DIAGNOSIS — E279 Disorder of adrenal gland, unspecified: Secondary | ICD-10-CM | POA: Diagnosis not present

## 2024-08-19 DIAGNOSIS — J449 Chronic obstructive pulmonary disease, unspecified: Secondary | ICD-10-CM | POA: Diagnosis not present

## 2024-08-19 DIAGNOSIS — E876 Hypokalemia: Secondary | ICD-10-CM | POA: Diagnosis present

## 2024-08-19 DIAGNOSIS — R54 Age-related physical debility: Secondary | ICD-10-CM | POA: Diagnosis present

## 2024-08-19 DIAGNOSIS — J410 Simple chronic bronchitis: Secondary | ICD-10-CM | POA: Diagnosis present

## 2024-08-19 DIAGNOSIS — Z87440 Personal history of urinary (tract) infections: Secondary | ICD-10-CM

## 2024-08-19 DIAGNOSIS — Z9981 Dependence on supplemental oxygen: Secondary | ICD-10-CM

## 2024-08-19 DIAGNOSIS — J811 Chronic pulmonary edema: Secondary | ICD-10-CM

## 2024-08-19 DIAGNOSIS — Z79899 Other long term (current) drug therapy: Secondary | ICD-10-CM

## 2024-08-19 DIAGNOSIS — Z91048 Other nonmedicinal substance allergy status: Secondary | ICD-10-CM

## 2024-08-19 DIAGNOSIS — K7460 Unspecified cirrhosis of liver: Secondary | ICD-10-CM | POA: Diagnosis not present

## 2024-08-19 DIAGNOSIS — Z9221 Personal history of antineoplastic chemotherapy: Secondary | ICD-10-CM

## 2024-08-19 DIAGNOSIS — R918 Other nonspecific abnormal finding of lung field: Secondary | ICD-10-CM | POA: Diagnosis not present

## 2024-08-19 LAB — COMPREHENSIVE METABOLIC PANEL WITH GFR
ALT: 8 U/L (ref 0–44)
AST: 16 U/L (ref 15–41)
Albumin: 3.4 g/dL — ABNORMAL LOW (ref 3.5–5.0)
Alkaline Phosphatase: 111 U/L (ref 38–126)
Anion gap: 12 (ref 5–15)
BUN: 8 mg/dL (ref 8–23)
CO2: 31 mmol/L (ref 22–32)
Calcium: 9.5 mg/dL (ref 8.9–10.3)
Chloride: 99 mmol/L (ref 98–111)
Creatinine, Ser: 0.83 mg/dL (ref 0.44–1.00)
GFR, Estimated: >60 mL/min
Glucose, Bld: 141 mg/dL — ABNORMAL HIGH (ref 70–99)
Potassium: 3.2 mmol/L — ABNORMAL LOW (ref 3.5–5.1)
Sodium: 142 mmol/L (ref 135–145)
Total Bilirubin: 0.3 mg/dL (ref 0.0–1.2)
Total Protein: 7.2 g/dL (ref 6.5–8.1)

## 2024-08-19 LAB — CBC
HCT: 39.6 % (ref 36.0–46.0)
Hemoglobin: 12.5 g/dL (ref 12.0–15.0)
MCH: 28.4 pg (ref 26.0–34.0)
MCHC: 31.6 g/dL (ref 30.0–36.0)
MCV: 90 fL (ref 80.0–100.0)
Platelets: 418 K/uL — ABNORMAL HIGH (ref 150–400)
RBC: 4.4 MIL/uL (ref 3.87–5.11)
RDW: 12.7 % (ref 11.5–15.5)
WBC: 12.1 K/uL — ABNORMAL HIGH (ref 4.0–10.5)
nRBC: 0 % (ref 0.0–0.2)

## 2024-08-19 LAB — BASIC METABOLIC PANEL WITH GFR
Anion gap: 11 (ref 5–15)
BUN: 8 mg/dL (ref 8–23)
CO2: 27 mmol/L (ref 22–32)
Calcium: 9 mg/dL (ref 8.9–10.3)
Chloride: 99 mmol/L (ref 98–111)
Creatinine, Ser: 0.69 mg/dL (ref 0.44–1.00)
GFR, Estimated: >60 mL/min
Glucose, Bld: 107 mg/dL — ABNORMAL HIGH (ref 70–99)
Potassium: 4.4 mmol/L (ref 3.5–5.1)
Sodium: 137 mmol/L (ref 135–145)

## 2024-08-19 LAB — TYPE AND SCREEN
ABO/RH(D): O POS
Antibody Screen: NEGATIVE

## 2024-08-19 LAB — ECHOCARDIOGRAM COMPLETE
AR max vel: 1.22 cm2
AV Area VTI: 1.29 cm2
AV Area mean vel: 1.2 cm2
AV Mean grad: 5.5 mmHg
AV Peak grad: 11.2 mmHg
Ao pk vel: 1.67 m/s
Area-P 1/2: 3.72 cm2
Height: 62 in
S' Lateral: 4 cm
Weight: 2048 [oz_av]

## 2024-08-19 LAB — PROTIME-INR
INR: 1 (ref 0.8–1.2)
Prothrombin Time: 13.7 s (ref 11.4–15.2)

## 2024-08-19 LAB — MRSA NEXT GEN BY PCR, NASAL: MRSA by PCR Next Gen: NOT DETECTED

## 2024-08-19 LAB — APTT: aPTT: 29 s (ref 24–36)

## 2024-08-19 LAB — TROPONIN T, HIGH SENSITIVITY
Troponin T High Sensitivity: 22 ng/L — ABNORMAL HIGH (ref 0–19)
Troponin T High Sensitivity: 22 ng/L — ABNORMAL HIGH (ref 0–19)

## 2024-08-19 LAB — ABO/RH: ABO/RH(D): O POS

## 2024-08-19 LAB — LIPASE, BLOOD: Lipase: 15 U/L (ref 11–51)

## 2024-08-19 MED ORDER — NIFEDIPINE ER OSMOTIC RELEASE 30 MG PO TB24
30.0000 mg | ORAL_TABLET | Freq: Every day | ORAL | Status: DC
  Administered 2024-08-19 – 2024-08-20 (×2): 30 mg via ORAL
  Filled 2024-08-19 (×3): qty 1

## 2024-08-19 MED ORDER — SENNA 8.6 MG PO TABS
1.0000 | ORAL_TABLET | Freq: Two times a day (BID) | ORAL | Status: DC | PRN
  Filled 2024-08-19: qty 1

## 2024-08-19 MED ORDER — HYDROMORPHONE HCL 1 MG/ML IJ SOLN
0.5000 mg | INTRAMUSCULAR | Status: DC | PRN
  Administered 2024-08-19 – 2024-08-28 (×20): 1 mg via INTRAVENOUS
  Filled 2024-08-19 (×21): qty 1

## 2024-08-19 MED ORDER — ACETAMINOPHEN 325 MG PO TABS
650.0000 mg | ORAL_TABLET | Freq: Four times a day (QID) | ORAL | Status: DC | PRN
  Administered 2024-08-25: 650 mg via ORAL
  Filled 2024-08-19 (×2): qty 2

## 2024-08-19 MED ORDER — HYDROMORPHONE HCL 1 MG/ML IJ SOLN
0.5000 mg | Freq: Once | INTRAMUSCULAR | Status: AC
  Administered 2024-08-19: 0.5 mg via INTRAVENOUS
  Filled 2024-08-19: qty 1

## 2024-08-19 MED ORDER — DOCUSATE SODIUM 100 MG PO CAPS
100.0000 mg | ORAL_CAPSULE | Freq: Two times a day (BID) | ORAL | Status: DC
  Administered 2024-08-19 – 2024-08-21 (×4): 100 mg via ORAL
  Filled 2024-08-19 (×5): qty 1

## 2024-08-19 MED ORDER — ESMOLOL HCL-SODIUM CHLORIDE 2000 MG/100ML IV SOLN
25.0000 ug/kg/min | INTRAVENOUS | Status: DC
  Administered 2024-08-19: 25 ug/kg/min via INTRAVENOUS
  Administered 2024-08-19: 100 ug/kg/min via INTRAVENOUS
  Administered 2024-08-19: 125 ug/kg/min via INTRAVENOUS
  Administered 2024-08-20: 25 ug/kg/min via INTRAVENOUS
  Administered 2024-08-20: 125 ug/kg/min via INTRAVENOUS
  Administered 2024-08-20: 225 ug/kg/min via INTRAVENOUS
  Administered 2024-08-20 (×2): 200 ug/kg/min via INTRAVENOUS
  Administered 2024-08-21 (×4): 300 ug/kg/min via INTRAVENOUS
  Administered 2024-08-21: 275 ug/kg/min via INTRAVENOUS
  Administered 2024-08-21: 225 ug/kg/min via INTRAVENOUS
  Administered 2024-08-21: 250 ug/kg/min via INTRAVENOUS
  Administered 2024-08-21 (×3): 300 ug/kg/min via INTRAVENOUS
  Administered 2024-08-21: 200 ug/kg/min via INTRAVENOUS
  Administered 2024-08-22 – 2024-08-23 (×13): 300 ug/kg/min via INTRAVENOUS
  Administered 2024-08-23: 225 ug/kg/min via INTRAVENOUS
  Administered 2024-08-23 (×2): 300 ug/kg/min via INTRAVENOUS
  Administered 2024-08-23: 275 ug/kg/min via INTRAVENOUS
  Administered 2024-08-23 (×2): 300 ug/kg/min via INTRAVENOUS
  Administered 2024-08-24: 25 ug/kg/min via INTRAVENOUS
  Administered 2024-08-24: 50 ug/kg/min via INTRAVENOUS
  Administered 2024-08-24: 75 ug/kg/min via INTRAVENOUS
  Filled 2024-08-19 (×3): qty 100
  Filled 2024-08-19: qty 200
  Filled 2024-08-19 (×10): qty 100
  Filled 2024-08-19: qty 200
  Filled 2024-08-19 (×2): qty 100
  Filled 2024-08-19: qty 300
  Filled 2024-08-19 (×3): qty 100
  Filled 2024-08-19: qty 300
  Filled 2024-08-19: qty 100
  Filled 2024-08-19: qty 200
  Filled 2024-08-19: qty 100
  Filled 2024-08-19: qty 300
  Filled 2024-08-19: qty 100
  Filled 2024-08-19: qty 200
  Filled 2024-08-19: qty 300
  Filled 2024-08-19: qty 100
  Filled 2024-08-19: qty 300

## 2024-08-19 MED ORDER — IPRATROPIUM-ALBUTEROL 0.5-2.5 (3) MG/3ML IN SOLN
3.0000 mL | Freq: Four times a day (QID) | RESPIRATORY_TRACT | Status: DC | PRN
  Administered 2024-08-23 – 2024-08-27 (×3): 3 mL via RESPIRATORY_TRACT
  Filled 2024-08-19 (×3): qty 3

## 2024-08-19 MED ORDER — POTASSIUM CHLORIDE 10 MEQ/100ML IV SOLN
10.0000 meq | INTRAVENOUS | Status: AC
  Administered 2024-08-19 (×4): 10 meq via INTRAVENOUS
  Filled 2024-08-19 (×4): qty 100

## 2024-08-19 MED ORDER — BENZONATATE 100 MG PO CAPS
100.0000 mg | ORAL_CAPSULE | Freq: Three times a day (TID) | ORAL | Status: DC | PRN
  Administered 2024-08-19: 100 mg via ORAL
  Filled 2024-08-19: qty 1

## 2024-08-19 MED ORDER — ONDANSETRON HCL 4 MG/2ML IJ SOLN
4.0000 mg | Freq: Once | INTRAMUSCULAR | Status: AC
  Administered 2024-08-19: 4 mg via INTRAVENOUS
  Filled 2024-08-19: qty 2

## 2024-08-19 MED ORDER — LABETALOL HCL 5 MG/ML IV SOLN
10.0000 mg | INTRAVENOUS | Status: DC | PRN
  Administered 2024-08-19 – 2024-08-21 (×7): 10 mg via INTRAVENOUS
  Filled 2024-08-19 (×6): qty 4

## 2024-08-19 MED ORDER — MORPHINE SULFATE (PF) 2 MG/ML IV SOLN: 2.0000 mg | INTRAVENOUS | Status: DC | PRN

## 2024-08-19 MED ORDER — METOPROLOL TARTRATE 12.5 MG HALF TABLET
12.5000 mg | ORAL_TABLET | Freq: Two times a day (BID) | ORAL | Status: DC
  Administered 2024-08-19 – 2024-08-20 (×3): 12.5 mg via ORAL
  Filled 2024-08-19 (×3): qty 1

## 2024-08-19 MED ORDER — LABETALOL HCL 5 MG/ML IV SOLN: 5.0000 mg | Freq: Once | INTRAVENOUS | Status: DC

## 2024-08-19 MED ORDER — LACTATED RINGERS IV SOLN: INTRAVENOUS | Status: DC

## 2024-08-19 MED ORDER — HYDROMORPHONE HCL 1 MG/ML IJ SOLN
1.0000 mg | Freq: Once | INTRAMUSCULAR | Status: AC
  Administered 2024-08-19: 1 mg via INTRAVENOUS
  Filled 2024-08-19: qty 1

## 2024-08-19 MED ORDER — POLYETHYLENE GLYCOL 3350 17 G PO PACK
17.0000 g | PACK | Freq: Every day | ORAL | Status: DC
  Administered 2024-08-20 – 2024-08-22 (×3): 17 g via ORAL
  Filled 2024-08-19 (×3): qty 1

## 2024-08-19 MED ORDER — POLYETHYLENE GLYCOL 3350 17 G PO PACK: 17.0000 g | PACK | Freq: Every day | ORAL | Status: DC | PRN

## 2024-08-19 MED ORDER — SODIUM CHLORIDE 0.9% FLUSH
10.0000 mL | Freq: Two times a day (BID) | INTRAVENOUS | Status: DC
  Administered 2024-08-19 – 2024-08-24 (×11): 10 mL
  Administered 2024-08-25: 40 mL
  Administered 2024-08-25 – 2024-09-01 (×14): 10 mL

## 2024-08-19 MED ORDER — IOHEXOL 350 MG/ML SOLN
75.0000 mL | Freq: Once | INTRAVENOUS | Status: AC | PRN
  Administered 2024-08-19: 75 mL via INTRAVENOUS

## 2024-08-19 MED ORDER — SODIUM CHLORIDE 0.9% FLUSH: 10.0000 mL | INTRAVENOUS | Status: DC | PRN

## 2024-08-19 MED ORDER — CHLORHEXIDINE GLUCONATE CLOTH 2 % EX PADS
6.0000 | MEDICATED_PAD | Freq: Every day | CUTANEOUS | Status: DC
  Administered 2024-08-19 – 2024-09-02 (×15): 6 via TOPICAL

## 2024-08-19 MED ORDER — MAGNESIUM SULFATE IN D5W 1-5 GM/100ML-% IV SOLN
1.0000 g | Freq: Once | INTRAVENOUS | Status: AC
  Administered 2024-08-19: 1 g via INTRAVENOUS
  Filled 2024-08-19: qty 100

## 2024-08-19 MED ORDER — OXYCODONE HCL 5 MG PO TABS
5.0000 mg | ORAL_TABLET | ORAL | Status: DC | PRN
  Administered 2024-08-19 – 2024-08-26 (×16): 5 mg via ORAL
  Filled 2024-08-19 (×16): qty 1

## 2024-08-19 MED ORDER — ONDANSETRON HCL 4 MG/2ML IJ SOLN
4.0000 mg | Freq: Four times a day (QID) | INTRAMUSCULAR | Status: DC | PRN
  Administered 2024-08-19 – 2024-08-30 (×10): 4 mg via INTRAVENOUS
  Filled 2024-08-19 (×10): qty 2

## 2024-08-19 MED ORDER — MORPHINE SULFATE (PF) 4 MG/ML IV SOLN
4.0000 mg | INTRAVENOUS | Status: DC | PRN
  Administered 2024-08-19: 4 mg via INTRAVENOUS
  Filled 2024-08-19: qty 1

## 2024-08-19 NOTE — Progress Notes (Signed)
 Interim CCM Progress Note  Acute type B Aortic Dissection  Patient admitted overnight with acute type B aortic dissection.  Here in ICU for blood pressure as well as pulse control in setting of.  Initially once arrived to ICU, not needing esmolol  infusion and managing with PRN labetalol . Eventually actually needing esmolol  infusion,  Metoprolol  12.5 mg BID started as well as Nifedipine  30mg  PO as well.      08/19/2024    1:30 PM 08/19/2024    1:15 PM 08/19/2024    1:00 PM  Vitals with BMI  Systolic 122 121 893  Diastolic 75 66 80  Pulse 66 67 68     P:  Continue HR goal of <60, and BP goal of 100-120, continue esmolol  infusion PRN labetalol  as needed Continue Cardiac tele  Vascular following appreciate assistance  - plan for CT scan within 48-72 hrs for follow up -re-notify vascular if signs of malperfusion develop  Continue prn diluadid and oxy IR for pain control   Christian Lillianne Eick AGACNP-BC   [place] [facility] 08/14/2024, 8:41 PM  Call Cell: [phone] if have any emergent needs

## 2024-08-19 NOTE — ED Provider Notes (Addendum)
 " [facility] DEPT [facility] Emergency Department Provider Note MRN:  [id number]  Arrival date & time: 08/19/24     Chief Complaint   Chest Pain   History of Present Illness   Alexis Mcclain is a 74 y.o. year-old female with a history of bladder cancer presenting to the ED with chief complaint of chest pain.  Severe chest pain that woke her up from sleep.  Pain radiates to the mid thoracic back.  Associated with shortness of breath.  Review of Systems  A thorough review of systems was obtained and all systems are negative except as noted in the HPI and PMH.   Patient's Health History    Past Medical History:  Diagnosis Date   Anxiety    Cancer (HCC)    bladder    Dizzy    occ last week or so   Dyspnea    with exertion   Hematuria last 3 months   History of blood transfusion 1980   after surgery for ovarian cyst rupture lost pregnancy   UTI (urinary tract infection)    on ciprofloxacin    Past Surgical History:  Procedure Laterality Date   ABDOMINAL HYSTERECTOMY  1990's   partial 1 ovary left   colonscopy  yrs ago   IR FLUORO GUIDE PORT INSERTION RIGHT  12/10/2017   IR US  GUIDE VASC ACCESS RIGHT  12/10/2017   surgery for ovarian cyst   1990's   TRANSURETHRAL RESECTION OF BLADDER TUMOR N/A 10/26/2017   Procedure: TRANSURETHRAL RESECTION OF BLADDER TUMOR / CYSTOSCOPY(TURBT);  Surgeon: Devere Lonni Righter, MD;  Location: [facility];  Service: Urology;  Laterality: N/A;  ONLY NEEDS 90 MIN   TUBAL LIGATION  1981   laparoscopic    History reviewed. No pertinent family history.  Social History   Socioeconomic History   Marital status: Divorced    Spouse name: Not on file   Number of children: Not on file   Years of education: Not on file   Highest education level: Not on file  Occupational History   Not on file  Tobacco Use   Smoking status: Every Day    Current packs/day: 1.50    Average packs/day: 1.5 packs/day for 42.0 years (63.0  ttl pk-yrs)    Types: Cigarettes   Smokeless tobacco: Never  Vaping Use   Vaping status: Never Used  Substance and Sexual Activity   Alcohol use: No   Drug use: No   Sexual activity: Not on file  Other Topics Concern   Not on file  Social History Narrative   Not on file   Social Drivers of Health   Tobacco Use: High Risk (08/19/2024)   Patient History    Smoking Tobacco Use: Every Day    Smokeless Tobacco Use: Never    Passive Exposure: Not on file  Financial Resource Strain: Not on file  Food Insecurity: Low Risk (07/09/2024)   Received from [facility]   Epic    Within the past 12 months, you worried that your food would run out before you got money to buy more: Never true    Within the past 12 months, the food you bought just didn't last and you didn't have money to get more. : Never true  Transportation Needs: No Transportation Needs (07/09/2024)   Received from [employer]    In the past 12 months, has lack of reliable transportation kept you from medical appointments, meetings, work or from getting things  needed for daily living? : No  Physical Activity: Not on file  Stress: Not on file  Social Connections: Not on file  Intimate Partner Violence: Not on file  Depression (EYV7-0): Not on file  Alcohol Screen: Not on file  Housing: Low Risk (07/09/2024)   Received from [facility]   Epic    What is your living situation today?: I have a steady place to live    Think about the place you live. Do you have problems with any of the following? Choose all that apply:: None/None on this list  Utilities: Low Risk (07/09/2024)   Received from [facility]   Utilities    In the past 12 months has the electric, gas, oil, or water company threatened to shut off services in your home? : No  Health Literacy: Not on file     Physical Exam   Vitals:   08/19/24 0430 08/19/24 0445  BP: 126/66 135/71  Pulse: 81 78  Resp: 20 15  Temp:    SpO2: 96% 99%     CONSTITUTIONAL: Ill-appearing, moderate distress due to pain NEURO/PSYCH:  Alert and oriented x 3, no focal deficits EYES:  eyes equal and reactive ENT/NECK:  no LAD, no JVD CARDIO: Regular rate, well-perfused, normal S1 and S2 PULM:  CTAB no wheezing or rhonchi GI/GU:  non-distended, non-tender MSK/SPINE:  No gross deformities, no edema SKIN:  no rash, atraumatic   *Additional and/or pertinent findings included in MDM below  Diagnostic and Interventional Summary    EKG Interpretation Date/Time:  Tuesday August 19 2024 04:09:58 EST Ventricular Rate:  94 PR Interval:    QRS Duration:  130 QT Interval:  402 QTC Calculation: 503 R Axis:   92  Text Interpretation: Atrial fibrillation IVCD, consider atypical RBBB Probable lateral infarct, age indeterminate Abnrm T, consider ischemia, anterolateral lds Confirmed by Theadore Sharper [phone]) on 08/19/2024 6:14:59 AM       Labs Reviewed  CBC - Abnormal; Notable for the following components:      Result Value   WBC 12.1 (*)    Platelets 418 (*)    All other components within normal limits  COMPREHENSIVE METABOLIC PANEL WITH GFR - Abnormal; Notable for the following components:   Potassium 3.2 (*)    Glucose, Bld 141 (*)    Albumin 3.4 (*)    All other components within normal limits  TROPONIN T, HIGH SENSITIVITY - Abnormal; Notable for the following components:   Troponin T High Sensitivity 22 (*)    All other components within normal limits  LIPASE, BLOOD  TYPE AND SCREEN  ABO/RH  TROPONIN T, HIGH SENSITIVITY    CT Angio Chest/Abd/Pel for Dissection W and/or Wo Contrast  Final Result    DG Chest Port 1 View  Final Result      Medications  esmolol  (BREVIBLOC ) 2000 mg / 100 mL (20 mg/mL) infusion (has no administration in time range)  HYDROmorphone  (DILAUDID ) injection 1 mg (1 mg Intravenous Given 08/19/24 0417)  ondansetron  (ZOFRAN ) injection 4 mg (4 mg Intravenous Given 08/19/24 0415)  iohexol  (OMNIPAQUE ) 350 MG/ML  injection 75 mL (75 mLs Intravenous Contrast Given 08/19/24 0454)  HYDROmorphone  (DILAUDID ) injection 0.5 mg (0.5 mg Intravenous Given 08/19/24 0512)     Procedures  /  Critical Care .Critical Care  Performed by: Theadore Sharper HERO, MD Authorized by: Theadore Sharper HERO, MD   Critical care provider statement:    Critical care time (minutes):  80   Critical care was necessary to  treat or prevent imminent or life-threatening deterioration of the following conditions: Acute aortic syndrome.   Critical care was time spent personally by me on the following activities:  Development of treatment plan with patient or surrogate, discussions with consultants, evaluation of patient's response to treatment, examination of patient, ordering and review of laboratory studies, ordering and review of radiographic studies, ordering and performing treatments and interventions, pulse oximetry, re-evaluation of patient's condition and review of old charts   ED Course and Medical Decision Making  Initial Impression and Ddx Initial concern for possible aortic dissection given the radiation to the back.  Other considerations include ACS, pulmonary embolism, pancreatitis, biliary colic.  Obtaining rapid CTA imaging of the aorta.  Past medical/surgical history that increases complexity of ED encounter: Bladder cancer  Interpretation of Diagnostics I personally reviewed the EKG and my interpretation is as follows: Evidence of global ischemia with diffuse ST depressions  Labs reveal mildly elevated troponin  Patient Reassessment and Ultimate Disposition/Management     CT imaging revealing acute aortic syndrome, more specifically a type B aortic hematoma.  Case discussed with Dr. Gari of vascular surgery.  Plan is for esmolol  drip for blood pressure control, admission to the ICU and Dr. Gari will come to evaluate the patient for operative intervention.  Patient management required discussion with the following services or  consulting groups:  Intensivist Service and vascular surgery  Complexity of Problems Addressed Acute illness or injury that poses threat of life of bodily function  Additional Data Reviewed and Analyzed Further history obtained from: Further history from spouse/family member  Additional Factors Impacting ED Encounter Risk Consideration of hospitalization  Ozell HERO. Theadore, MD [facility] Emergency Medicine [facility] [email or web address]  Final Clinical Impressions(s) / ED Diagnoses     ICD-10-CM   1. Intramural aortic hematoma (HCC)  I71.00       ED Discharge Orders     None        Discharge Instructions Discussed with and Provided to Patient:   Discharge Instructions   None      Theadore Ozell HERO, MD 08/19/24 [id number]    Theadore Ozell HERO, MD 08/19/24 [phone]  "

## 2024-08-19 NOTE — Consult Note (Signed)
 " ED Consult    Reason for Consult:  type B aortic dissection Referring Physician:  Dr. Theadore MRN #:  [id number]  History of Present Illness: This is a 74 y.o. female with long history of smoking history of COPD with previous bladder cancer required surgery and chemotherapy.  She presented overnight with acute sharp feeling chest pain radiating directly through to her back.  She has never had pain like this in the past.  Pain better controlled since admission.  No abdominal or lower extremity pain.  Past Medical History:  Diagnosis Date   Anxiety    Cancer (HCC)    bladder    Dizzy    occ last week or so   Dyspnea    with exertion   Hematuria last 3 months   History of blood transfusion 1980   after surgery for ovarian cyst rupture lost pregnancy   UTI (urinary tract infection)    on ciprofloxacin    Past Surgical History:  Procedure Laterality Date   ABDOMINAL HYSTERECTOMY  1990's   partial 1 ovary left   colonscopy  yrs ago   IR FLUORO GUIDE PORT INSERTION RIGHT  12/10/2017   IR US  GUIDE VASC ACCESS RIGHT  12/10/2017   surgery for ovarian cyst   1990's   TRANSURETHRAL RESECTION OF BLADDER TUMOR N/A 10/26/2017   Procedure: TRANSURETHRAL RESECTION OF BLADDER TUMOR / CYSTOSCOPY(TURBT);  Surgeon: Devere Lonni Righter, MD;  Location: [facility];  Service: Urology;  Laterality: N/A;  ONLY NEEDS 90 MIN   TUBAL LIGATION  1981   laparoscopic    Allergies[1]  Prior to Admission medications  Medication Sig Start Date End Date Taking? Authorizing Provider  albuterol  (VENTOLIN  HFA) 108 (90 Base) MCG/ACT inhaler Inhale 2 puffs into the lungs every 6 (six) hours as needed for wheezing. 08/08/24  Yes [provider]  ibuprofen (ADVIL) 200 MG tablet Take 200-400 mg by mouth daily as needed for moderate pain (pain score 4-6).   Yes [provider]  Moringa Oleifera (MORINGA PO) Take 1 capsule by mouth daily.   Yes [provider]    Social  History   Socioeconomic History   Marital status: Divorced    Spouse name: Not on file   Number of children: Not on file   Years of education: Not on file   Highest education level: Not on file  Occupational History   Not on file  Tobacco Use   Smoking status: Every Day    Current packs/day: 1.50    Average packs/day: 1.5 packs/day for 42.0 years (63.0 ttl pk-yrs)    Types: Cigarettes   Smokeless tobacco: Never  Vaping Use   Vaping status: Never Used  Substance and Sexual Activity   Alcohol use: No   Drug use: No   Sexual activity: Not on file  Other Topics Concern   Not on file  Social History Narrative   Not on file   Social Drivers of Health   Tobacco Use: High Risk (08/19/2024)   Patient History    Smoking Tobacco Use: Every Day    Smokeless Tobacco Use: Never    Passive Exposure: Not on file  Financial Resource Strain: Not on file  Food Insecurity: Low Risk (07/09/2024)   Received from [facility]   Epic    Within the past 12 months, you worried that your food would run out before you got money to buy more: Never true    Within the past 12 months, the  food you bought just didn't last and you didn't have money to get more. : Never true  Transportation Needs: No Transportation Needs (07/09/2024)   Received from [facility]    In the past 12 months, has lack of reliable transportation kept you from medical appointments, meetings, work or from getting things needed for daily living? : No  Physical Activity: Not on file  Stress: Not on file  Social Connections: Not on file  Intimate Partner Violence: Not on file  Depression (EYV7-0): Not on file  Alcohol Screen: Not on file  Housing: Low Risk (07/09/2024)   Received from [facility]   Epic    What is your living situation today?: I have a steady place to live    Think about the place you live. Do you have problems with any of the following? Choose all that apply:: None/None on this list   Utilities: Low Risk (07/09/2024)   Received from [facility]   Utilities    In the past 12 months has the electric, gas, oil, or water company threatened to shut off services in your home? : No  Health Literacy: Not on file   History reviewed. No pertinent family history.  Review of Systems  Constitutional: Negative.   HENT: Negative.    Respiratory:  Positive for shortness of breath.   Cardiovascular:  Positive for chest pain.  Gastrointestinal: Negative.   Musculoskeletal:  Positive for back pain.  Skin: Negative.   Neurological: Negative.   Endo/Heme/Allergies: Negative.   Psychiatric/Behavioral: Negative.        Physical Examination  Vitals:   08/19/24 0615 08/19/24 0630  BP: 121/65 123/64  Pulse: 67 69  Resp: (!) 31 18  Temp:    SpO2: 95% 96%   Body mass index is 23.41 kg/m.  Physical Exam HENT:     Head: Normocephalic.  Cardiovascular:     Rate and Rhythm: Normal rate.     Pulses:          Dorsalis pedis pulses are 2+ on the right side and 2+ on the left side.  Pulmonary:     Effort: Pulmonary effort is normal.  Abdominal:     General: Abdomen is flat.     Palpations: Abdomen is soft.  Musculoskeletal:        General: Normal range of motion.     Cervical back: Normal range of motion and neck supple.     Right lower leg: No edema.     Left lower leg: No edema.  Skin:    General: Skin is warm.     Capillary Refill: Capillary refill takes less than 2 seconds.  Neurological:     General: No focal deficit present.     Mental Status: She is alert. Mental status is at baseline.      CBC    Component Value Date/Time   WBC 12.1 (H) 08/19/2024 0416   RBC 4.40 08/19/2024 0416   HGB 12.5 08/19/2024 0416   HGB 10.8 (L) 01/02/2018 0920   HCT 39.6 08/19/2024 0416   PLT 418 (H) 08/19/2024 0416   PLT 604 (H) 01/02/2018 0920   MCV 90.0 08/19/2024 0416   MCH 28.4 08/19/2024 0416   MCHC 31.6 08/19/2024 0416   RDW 12.7 08/19/2024 0416   LYMPHSABS 2.1  01/02/2018 0920   MONOABS 0.8 01/02/2018 0920   EOSABS 0.1 01/02/2018 0920   BASOSABS 0.0 01/02/2018 0920    BMET    Component Value Date/Time  NA 142 08/19/2024 0416   K 3.2 (L) 08/19/2024 0416   CL 99 08/19/2024 0416   CO2 31 08/19/2024 0416   GLUCOSE 141 (H) 08/19/2024 0416   BUN 8 08/19/2024 0416   CREATININE 0.83 08/19/2024 0416   CREATININE 0.81 01/02/2018 0920   CALCIUM 9.5 08/19/2024 0416   GFRNONAA >60 08/19/2024 0416   GFRNONAA >60 01/02/2018 0920   GFRAA >60 01/02/2018 0920    COAGS: Lab Results  Component Value Date   INR 0.87 12/10/2017     Non-Invasive Vascular Imaging:   CTA CHEST, ABDOMEN AND PELVIS WITH AND WITHOUT CONTRAST 08/19/2024 04:53:37 AM   TECHNIQUE: CTA of the chest was performed with and without the administration of 75 mL of intravenous contrast (iohexol  (OMNIPAQUE ) 350 MG/ML injection 75 mL IOHEXOL  350 MG/ML SOLN). CTA of the abdomen and pelvis was performed with administration of 75 mL of intravenous contrast (iohexol  (OMNIPAQUE ) 350 MG/ML injection 75 mL IOHEXOL  350 MG/ML SOLN). Multiplanar reformatted images are provided for review. MIP images are provided for review. Automated exposure control, iterative reconstruction, and/or weight based adjustment of the mA/kV was utilized to reduce the radiation dose to as low as reasonably achievable.   COMPARISON: Portable chest from today, CT abdomen and pelvis with IV contrast 02/08/2018, and PET CT 12/08/2017.   CLINICAL HISTORY: Acute aortic syndrome (AAS) suspected. Chest pain and shortness of breath.   FINDINGS:   VASCULATURE: AORTA: There is an acute type B thoracic aortic intramural hematoma, extending from the origin of the left subclavian artery inferiorly along the left lateral wall in the isthmus and proximal descending segment and continuing into the anterior / left lateral wall in the mid to distal descending aorta, tapering to the lowest extent and ending along the  left lateral wall in between the celiac and SMA takeoff.   The maximum thickness of the hematoma is 10 mm, in the proximal descending aorta on coronal series 10, image 78. See also axial series 6, image 43. Caliber of the proximal descending aorta is 3.4 x 3.4 cm, mid descending aorta 3.0 x 3.1 cm on axial 75 of 75 series 6. Maximum caliber at the hiatus is 3.1 x 2.7 cm.   The more proximal thoracic aorta is within normal caliber limits. Calcific plaques noted in the aortic arch and descending segment.   On series 10, image 51, there is a 6 x 4 mm small penetrating ulcer into the proximal aspect of the hematoma along the lower anterior wall of the distal arch, but no other opacification in the hematoma being seen. No intramural blood pool is seen.   In the abdominal aorta, there is moderate mixed plaque. Some of the soft plaque has an ulcerative appearance, but there is no penetrating ulcer, dissection, stenosis, or aneurysm. No abdominal aortic aneurysm.   PULMONARY ARTERIES: The pulmonary arteries are well opacified and without evidence of thrombus. No pulmonary embolism with the limits of this exam.   GREAT VESSELS OF AORTIC ARCH: Mild atherosclerosis of the great vessels without dissection, stenosis or aneurysm. There is a normal variant 4 vessel arch with aortic arch origin of the left subclavian artery.   CELIAC TRUNK: No acute finding. No occlusion or significant stenosis.   SUPERIOR MESENTERIC ARTERY: No acute finding. No occlusion or significant stenosis.   INFERIOR MESENTERIC ARTERY: No acute finding. No occlusion or significant stenosis.   RENAL ARTERIES: Both renal arteries are single. There are mild calcified plaques of the renal arteries, but no significant stenosis,  dissection or aneurysm.   ILIAC ARTERIES: There are patchy calcifications in the common iliac and internal iliac arteries, minimal scattered calcifications in the external iliac arteries.  No iliac arterial stenosis is seen bilaterally.   CHEST:   MEDIASTINUM: Cardiac size is normal. There are scattered calcifications in the LAD and right coronary artery. The pulmonary veins are nondilated. No pericardial effusion.   No mediastinal lymphadenopathy. There is a small hiatal hernia. No significant thyroid nodules requiring follow-up. There is a right chest port with a right subclavian approach catheter terminating at the superior cavoatrial junction.   LUNGS AND PLEURA: There is biapical pleuroparenchymal scarring. Lungs are moderately emphysematous, centrilobular disease predominating ; diffuse bronchial thickening.   3.4 cm pneumatocele posteriorly in the left lower lobe. At the inferior aspect of this, there is a somewhat flat ground-glass nodule measuring 1.4 x 1 cm, not seen previously.   There is a 3 mm right upper lobe nodule laterally series 8 axial 38. Scarring noted in the medial base of the right middle lobe, atelectasis versus scarring in the lingular base.   No pleural effusion. No pneumothorax.   THORACIC BONES AND SOFT TISSUES: There is osteopenia with mild thoracic spondylosis. No acute thoracic skeletal findings. No acute soft tissue abnormality.   ABDOMEN AND PELVIS:   LIVER: Mildly cirrhotic liver configuration noted with mild capsular nodularity over portions consistent with cirrhosis. There is a calcified granuloma in the dome of segment 4, but no mass is seen.   GALLBLADDER AND BILE DUCTS: Gallbladder is unremarkable. No biliary ductal dilatation.   SPLEEN: The spleen is unremarkable.   PANCREAS: The pancreas is unremarkable.   ADRENAL GLANDS: There is a 2 cm right adrenal adenoma, Hounsfield density is 7. There is a 2.4 cm left adrenal nodule which was previously 1.7 cm, indeterminate by density.   Chemical shift MRI recommended.   KIDNEYS, URETERS AND BLADDER: In the posterior upper left kidney, there is 1.3 cm low-density  lesion of 30 HU, indeterminate by density. Attention on MRI recommended.   The kidneys otherwise are unremarkable. No stones in the kidneys or ureters. No hydronephrosis. No perinephric or periureteral stranding. The bladder is contracted and not well seen. There are no inflammatory changes around it.   GI AND BOWEL: Stomach and duodenal sweep demonstrate no acute abnormality. There is a small hiatal hernia. No bowel obstruction or inflammation. Normal appendix. No abnormal bowel wall thickening or distension.   REPRODUCTIVE: The uterus is absent. No adnexal mass.   PERITONEUM AND RETROPERITONEUM: No ascites or free air. There is a small umbilical fat hernia. No free fluid, free hemorrhage, or incarcerated hernia.   LYMPH NODES: No lymphadenopathy.   ABDOMINAL BONES AND SOFT TISSUES: Degenerative change of the lumbar spine. No acute or significant osseous findings. Grade 1 listhesis L4-L5 with advanced facet hypertrophy. There is a small umbilical fat hernia. No acute soft tissue abnormality.   IMPRESSION: 1. Acute type B thoracic aortic intramural hematoma starting at the left subclavian origin and ending between the celiac and SMA origins along the left wall, with a small 6 x 4 mm penetrating ulcer in the distal arch, and no other enhancement in the hematoma. 2. Maximum hematoma thickness is 10 mm. Descending aortic measurements as above. 3. New 1.4 x 1.0 cm flat ground-glass left lower lobe nodule, recommend chest CT at 6-12 months to confirm persistence, then CT every 2 years until 5 years if persistent, as per Fleischner Society Guidelines. 4. 3 mm right upper  lobe pulmonary nodule. 5. Moderate pulmonary emphysema is an independent risk factor for lung cancer; recommend consideration for evaluation for a low-dose CT lung cancer screening program. 6. 2.4 cm left adrenal nodule, previously 1.7 cm, indeterminate, recommend adrenal chemical-shift MRI. 7. 1.3 cm left renal  lesion measuring 30 HU, indeterminate, recommend renal protocol MRI. 8. Small hiatal hernia. 9. Cirrhotic changes of the liver with mild steatosis. No splenomegaly. No ascites. 10. Branch vessel atherosclerosis but no aneurysm flow-limiting stenoses. 11. . Results discussed with Dr. Theadore at 06:06 am, 07/31/2024, with verbal acknowledgment of findings.     ASSESSMENT/PLAN: This is a 74 y.o. female here with type B aortic intramural hematoma.  Based on the current size thickness of the hematoma as well as improving symptoms she does not require any surgical intervention at this time.  I discussed the need for her hospitalization to include pain control as well as impulse control with systolic blood pressure less than 120 and heart rate less than 80.  We discussed the plan to repeat CT scan in 48 to 72 hours as long as her pain is well-controlled and she does not have any evidence of malperfusion.  All questions were answered in the presence of her daughter and they demonstrate good understanding.  Vascular surgery follow-up closely.  Kaylib Furness C. Sheree, MD [facility] Office: [phone] Pager: [phone]     [1]  Allergies Allergen Reactions   Nickel     Whelps   Tape Other (See Comments)    Burns skin    Hurt when pulled off   "

## 2024-08-19 NOTE — ED Provider Notes (Signed)
 Concern for aortic dissection getting immediate CT imaging without labs.   Theadore Ozell HERO, MD 08/19/24 0500

## 2024-08-19 NOTE — H&P (Signed)
 "  NAME:  Alexis Mcclain, MRN:  [id number], DOB:  [birth year], LOS: 0 ADMISSION DATE:  08/19/2024, CONSULTATION DATE:  08/19/2024 REFERRING MD:  Theadore, MD, CHIEF COMPLAINT:  chest pain 2:45 am   History of Present Illness:  A 74 yr old female patient with emphysema, and tobacco smoking (63 PY) on Albuterol  PRN, who awoke up at 2:45 am with severe sharp chest pain radiating to the mid back with SOB. She has chronic smoker cough and mild LL edema (stopped lasix one year ago). No f/c/r, N/V, abd pain, syncope, wheezing, or hemoptysis. No alcohol or illicit drug use. No anti-PLT or AC meds. EMS gave her: 2 nito and 4 baby asa. Initial BP 212/108, then dropped to 134/82.  Pertinent  Medical History  COPD (emphysema), bladder Ca with tumor removal and chemoRx 2019.    Significant Hospital Events: Including procedures, antibiotic start and stop dates in addition to other pertinent events   As above   Interim History / Subjective:  Chest pain is better after BP control   Objective    Blood pressure 123/64, pulse 69, temperature 98.5 F (36.9 C), resp. rate 18, height 5' 2 (1.575 m), weight 58.1 kg, SpO2 96%.       No intake or output data in the 24 hours ending 08/19/24 0701 Filed Weights   08/19/24 0401  Weight: 58.1 kg    Examination: SpO2 88%, placed on O2 @ 2 L [place] General: alert, oriented x4, and mild discomfort.   HENT: PERL, normal pharynx and oral mucosa. No LNE or thyromegaly. No JVD Lungs: symmetrical air entry bilaterally. Decreased air entry bilaterally. No crackles. Mild exp wheezing Cardiovascular: NL S1/S2. No m/g/r Abdomen: no distension or tenderness Extremities: trace edema. Symmetrical  Neuro: nonfocal    Resolved problem list   Assessment and Plan  Acute type B thoracic aortic intramural hematoma Admit to ICU HR <60 and SBP 100-120 mmHg with BB Pain Mx NPO Vascular surgical consult  Echo I/O chart IVF  HypoK Replace Am labs  Emphysema  Duoneb  PRN Outpatient PFT I/S Tobacco smoking cessation counseling   New 1.4 x 1.0 cm flat ground-glass left lower lobe nodule and 3 mm right upper lobe pulmonary nodule F/u LDCT 6-12 months to confirm persistence, then annual LDCT  2.4 cm left adrenal nodule, previously 1.7 cm, indeterminate Adrenal chemical-shift MRI after acute event Mx  1.3 cm left renal lesion Renal protocol MRI acute event Mx  Cirrhotic changes of the liver with mild steatosis  Labs   CBC: Recent Labs  Lab 08/19/24 0416  WBC 12.1*  HGB 12.5  HCT 39.6  MCV 90.0  PLT 418*    Basic Metabolic Panel: Recent Labs  Lab 08/19/24 0416  NA 142  K 3.2*  CL 99  CO2 31  GLUCOSE 141*  BUN 8  CREATININE 0.83  CALCIUM 9.5   GFR: Estimated Creatinine Clearance: 49.2 mL/min (by C-G formula based on SCr of 0.83 mg/dL). Recent Labs  Lab 08/19/24 0416  WBC 12.1*    Liver Function Tests: Recent Labs  Lab 08/19/24 0416  AST 16  ALT 8  ALKPHOS 111  BILITOT 0.3  PROT 7.2  ALBUMIN 3.4*   Recent Labs  Lab 08/19/24 0416  LIPASE 15   No results for input(s): AMMONIA in the last 168 hours.  ABG No results found for: PHART, PCO2ART, PO2ART, HCO3, TCO2, ACIDBASEDEF, O2SAT   Coagulation Profile: No results for input(s): INR, PROTIME in the last 168 hours.  Cardiac Enzymes: No results for input(s): CKTOTAL, CKMB, CKMBINDEX, TROPONINI in the last 168 hours.  HbA1C: No results found for: HGBA1C  CBG: No results for input(s): GLUCAP in the last 168 hours.  Review of Systems:   Review of Systems  Constitutional:  Negative for chills, fever and weight loss.  HENT:  Negative for sore throat.   Respiratory:  Positive for cough and shortness of breath. Negative for hemoptysis, sputum production, wheezing and stridor.   Cardiovascular:  Positive for chest pain and leg swelling. Negative for palpitations, orthopnea, claudication and PND.  Gastrointestinal:  Negative for  abdominal pain, diarrhea, heartburn, nausea and vomiting.  Genitourinary:  Negative for dysuria, hematuria and urgency.  Skin:  Negative for rash.  Neurological:  Negative for dizziness, focal weakness, seizures, weakness and headaches.     Past Medical History:  She,  has a past medical history of Anxiety, Cancer (HCC), Dizzy, Dyspnea, Hematuria (last 3 months), History of blood transfusion (1980), and UTI (urinary tract infection).   Surgical History:   Past Surgical History:  Procedure Laterality Date   ABDOMINAL HYSTERECTOMY  1990's   partial 1 ovary left   colonscopy  yrs ago   IR FLUORO GUIDE PORT INSERTION RIGHT  12/10/2017   IR US  GUIDE VASC ACCESS RIGHT  12/10/2017   surgery for ovarian cyst   1990's   TRANSURETHRAL RESECTION OF BLADDER TUMOR N/A 10/26/2017   Procedure: TRANSURETHRAL RESECTION OF BLADDER TUMOR / CYSTOSCOPY(TURBT);  Surgeon: Devere Lonni Righter, MD;  Location: [facility];  Service: Urology;  Laterality: N/A;  ONLY NEEDS 90 MIN   TUBAL LIGATION  1981   laparoscopic     Social History:   reports that she has been smoking cigarettes. She has a 63 pack-year smoking history. She has never used smokeless tobacco. She reports that she does not drink alcohol and does not use drugs.   Family History:  Her family history is not on file.   Allergies Allergies[1]   Home Medications  Prior to Admission medications  Medication Sig Start Date End Date Taking? Authorizing Provider  albuterol  (VENTOLIN  HFA) 108 (90 Base) MCG/ACT inhaler Inhale 2 puffs into the lungs every 6 (six) hours as needed for wheezing. 08/08/24  Yes [provider]  ibuprofen (ADVIL) 200 MG tablet Take 200-400 mg by mouth daily as needed for moderate pain (pain score 4-6).   Yes [provider]  Moringa Oleifera (MORINGA PO) Take 1 capsule by mouth daily.   Yes [provider]     Critical care time: 50 min   Mancel Ply, MD [facility] Pager: see AMION            [1]  Allergies Allergen Reactions   Nickel     Whelps   Tape Other (See Comments)    Burns skin    Hurt when pulled off   "

## 2024-08-19 NOTE — Progress Notes (Signed)

## 2024-08-19 NOTE — ED Triage Notes (Signed)
 Woke up at 245 with chest pain that originates in the center of chest and radiates to the mid back.  EMS VS 212/108 After 2 nito and 4 baby asa 134/82 Pain went from a 10 to a 6 on 1-10 pain scale On 2 ltrs for o2 sat of 88 room air.

## 2024-08-20 DIAGNOSIS — K760 Fatty (change of) liver, not elsewhere classified: Secondary | ICD-10-CM

## 2024-08-20 DIAGNOSIS — J439 Emphysema, unspecified: Secondary | ICD-10-CM

## 2024-08-20 DIAGNOSIS — E279 Disorder of adrenal gland, unspecified: Secondary | ICD-10-CM

## 2024-08-20 DIAGNOSIS — K7460 Unspecified cirrhosis of liver: Secondary | ICD-10-CM

## 2024-08-20 DIAGNOSIS — R918 Other nonspecific abnormal finding of lung field: Secondary | ICD-10-CM

## 2024-08-20 DIAGNOSIS — I71012 Dissection of descending thoracic aorta: Secondary | ICD-10-CM | POA: Diagnosis not present

## 2024-08-20 DIAGNOSIS — F17210 Nicotine dependence, cigarettes, uncomplicated: Secondary | ICD-10-CM

## 2024-08-20 DIAGNOSIS — N289 Disorder of kidney and ureter, unspecified: Secondary | ICD-10-CM

## 2024-08-20 LAB — CBC
HCT: 37.6 % (ref 36.0–46.0)
Hemoglobin: 11.7 g/dL — ABNORMAL LOW (ref 12.0–15.0)
MCH: 28.1 pg (ref 26.0–34.0)
MCHC: 31.1 g/dL (ref 30.0–36.0)
MCV: 90.2 fL (ref 80.0–100.0)
Platelets: 345 K/uL (ref 150–400)
RBC: 4.17 MIL/uL (ref 3.87–5.11)
RDW: 13.2 % (ref 11.5–15.5)
WBC: 14.7 K/uL — ABNORMAL HIGH (ref 4.0–10.5)
nRBC: 0 % (ref 0.0–0.2)

## 2024-08-20 LAB — PHOSPHORUS: Phosphorus: 2.5 mg/dL (ref 2.5–4.6)

## 2024-08-20 LAB — BASIC METABOLIC PANEL WITH GFR
Anion gap: 7 (ref 5–15)
BUN: 8 mg/dL (ref 8–23)
CO2: 32 mmol/L (ref 22–32)
Calcium: 9.1 mg/dL (ref 8.9–10.3)
Chloride: 99 mmol/L (ref 98–111)
Creatinine, Ser: 0.66 mg/dL (ref 0.44–1.00)
GFR, Estimated: >60 mL/min
Glucose, Bld: 106 mg/dL — ABNORMAL HIGH (ref 70–99)
Potassium: 4.6 mmol/L (ref 3.5–5.1)
Sodium: 137 mmol/L (ref 135–145)

## 2024-08-20 LAB — MAGNESIUM: Magnesium: 1.8 mg/dL (ref 1.7–2.4)

## 2024-08-20 MED ORDER — MAGNESIUM SULFATE 2 GM/50ML IV SOLN
2.0000 g | Freq: Once | INTRAVENOUS | Status: AC
  Administered 2024-08-20: 2 g via INTRAVENOUS
  Filled 2024-08-20: qty 50

## 2024-08-20 MED ORDER — METOPROLOL TARTRATE 50 MG PO TABS
50.0000 mg | ORAL_TABLET | Freq: Two times a day (BID) | ORAL | Status: DC
  Administered 2024-08-20 – 2024-08-21 (×2): 50 mg via ORAL
  Filled 2024-08-20 (×2): qty 1

## 2024-08-20 NOTE — Plan of Care (Signed)

## 2024-08-20 NOTE — Progress Notes (Addendum)
" ° °  NAME:  Alexis Mcclain, MRN:  [id number], DOB:  1951-07-25, LOS: 1 ADMISSION DATE:  08/19/2024, CONSULTATION DATE:  08/19/2024 REFERRING MD:  Theadore, MD, CHIEF COMPLAINT:  chest pain 2:45 am   History of Present Illness:  A 74 yr old female patient with emphysema, and tobacco smoking (63 PY) on Albuterol  PRN, who awoke up at 2:45 am with severe sharp chest pain radiating to the mid back with SOB. She has chronic smoker cough and mild LL edema (stopped lasix one year ago). No f/c/r, N/V, abd pain, syncope, wheezing, or hemoptysis. No alcohol or illicit drug use. No anti-PLT or AC meds. EMS gave her: 2 nito and 4 baby asa. Initial BP 212/108, then dropped to 134/82.  Pertinent  Medical History  COPD (emphysema), bladder Ca with tumor removal and chemoRx 2019.    Significant Hospital Events: Including procedures, antibiotic start and stop dates in addition to other pertinent events   1/20 admitted. ECHO LVEF 60-65%, RV fxn nml. Gd I diastolic dysfxn 1/21 still on esmolol  gtt. Still having some pain.   Interim History / Subjective:  Still has CP that radiates to back. Better w/ analgesia but still present   Objective    Blood pressure (!) 109/55, pulse 74, temperature 98.9 F (37.2 C), temperature source Oral, resp. rate 17, height 5' 2 (1.575 m), weight 67.9 kg, SpO2 92%.        Intake/Output Summary (Last 24 hours) at 08/20/2024 1006 Last data filed at 08/20/2024 0700 Gross per 24 hour  Intake 2509.9 ml  Output 700 ml  Net 1809.9 ml   Filed Weights   08/19/24 0401 08/20/24 0500  Weight: 58.1 kg 67.9 kg    Examination: General sitting up in bed no distress HENT NCAT no JVD  Pulm faint posterior wheeze. No accessory use, speaking in full sentences. Currently in 4 lpm  Card rrr w/out MRG Abd soft Ext warm equal LE pulses no paraesthesia  Neuro intact  Resolved problem list   Assessment and Plan  Acute type B thoracic aortic intramural hematoma Plan Cont esmolol  gtt goal  SBP <120 goal HR < 70 (per society of thoracic surgeons/American Association of thoracic surgery clinical guidelines (Want to be more aggressive w/ Impulse control w/ pain still present)  Increase metoprolol  to 50mg  bid Cont nifedipine  30mg  qd Cont PRN analgesia  Heart healthy diet  F/u CT tomorrow  KVO IVFs  Emphysema w/ on-going tobacco abuse;  1.5ppd smoker  plan Duoneb PRN Outpatient PFT I/S Tobacco smoking cessation counseling   New 1.4 x 1.0 cm flat ground-glass left lower lobe nodule and 3 mm right upper lobe pulmonary nodule plan F/u LDCT 6-12 months to confirm persistence, then annual LDCT Will set her up in office for OP follow up   2.4 cm left adrenal nodule, previously 1.7 cm, indeterminate & 1.3cm left renal lesion  plan Adrenal chemical-shift MRI after acute event Mx Renal protocol MRI acute event Mx  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

## 2024-08-20 NOTE — TOC Initial Note (Signed)
 Transition of Care [facility]) - Initial/Assessment Note    Patient Details  Name: Alexis Mcclain MRN: [id number] Date of Birth: [birth year]  Transition of [facility]) CM/SW Contact:    Marval Gell, RN Phone Number: 08/20/2024, 12:12 PM  Clinical Narrative:                  Patient admitted yesterday through ED for aortic dissection and hypertensive episode.  BP and chest pain better controlled today per chart review  Patient has primary care outpatient, added to AVS for patient to call and schedule once discharged.   Expected Discharge Plan: Home w Home Health Services Barriers to Discharge: Continued Medical Work up   Patient Goals and CMS Choice            Expected Discharge Plan and Services                                              Prior Living Arrangements/Services                       Activities of Daily Living   ADL Screening (condition at time of admission) Independently performs ADLs?: Yes (appropriate for developmental age) Is the patient deaf or have difficulty hearing?: No Does the patient have difficulty seeing, even when wearing glasses/contacts?: No Does the patient have difficulty concentrating, remembering, or making decisions?: No  Permission Sought/Granted                  Emotional Assessment              Admission diagnosis:  Aortic dissection (HCC) [I71.00] Intramural aortic hematoma (HCC) [I71.00] Patient Active Problem List   Diagnosis Date Noted   Aortic dissection (HCC) 08/19/2024   Port-A-Cath in place 12/12/2017   Bladder tumor 10/26/2017   PCP:  Jesus Elberta Gainer, FNP Pharmacy:   [facility] [phone] - [place], [place] - [address] [place] [address] Phone: [phone] Fax: [phone]     Social Drivers of Health (SDOH) Social History: SDOH Screenings   Food Insecurity: No Food Insecurity (08/19/2024)  Housing:  Low Risk (08/19/2024)  Transportation Needs: No Transportation Needs (08/19/2024)  Utilities: Not At Risk (08/19/2024)  Tobacco Use: High Risk (08/19/2024)   SDOH Interventions:     Readmission Risk Interventions     No data to display

## 2024-08-20 NOTE — Progress Notes (Addendum)
" °  Progress Note    08/20/2024 7:40 AM * No surgery found *  Subjective:  denies any chest or back pain    Vitals:   08/20/24 0645 08/20/24 0700  BP: 130/64 111/60  Pulse: 76 76  Resp: (!) 25 (!) 23  Temp:    SpO2: 92% 90%    Physical Exam: General:  sitting up in bed, NAD Cardiac:  regular rate and rhythm, SBP <120 Lungs:  nonlabored Extremities:  palpable DP pulses bilaterally  CBC    Component Value Date/Time   WBC 14.7 (H) 08/20/2024 0221   RBC 4.17 08/20/2024 0221   HGB 11.7 (L) 08/20/2024 0221   HGB 10.8 (L) 01/02/2018 0920   HCT 37.6 08/20/2024 0221   PLT 345 08/20/2024 0221   PLT 604 (H) 01/02/2018 0920   MCV 90.2 08/20/2024 0221   MCH 28.1 08/20/2024 0221   MCHC 31.1 08/20/2024 0221   RDW 13.2 08/20/2024 0221   LYMPHSABS 2.1 01/02/2018 0920   MONOABS 0.8 01/02/2018 0920   EOSABS 0.1 01/02/2018 0920   BASOSABS 0.0 01/02/2018 0920    BMET    Component Value Date/Time   NA 137 08/19/2024 1558   K 4.4 08/19/2024 1558   CL 99 08/19/2024 1558   CO2 27 08/19/2024 1558   GLUCOSE 107 (H) 08/19/2024 1558   BUN 8 08/19/2024 1558   CREATININE 0.69 08/19/2024 1558   CREATININE 0.81 01/02/2018 0920   CALCIUM 9.0 08/19/2024 1558   GFRNONAA >60 08/19/2024 1558   GFRNONAA >60 01/02/2018 0920   GFRAA >60 01/02/2018 0920    INR    Component Value Date/Time   INR 1.0 08/19/2024 0910     Intake/Output Summary (Last 24 hours) at 08/20/2024 0740 Last data filed at 08/20/2024 0700 Gross per 24 hour  Intake 2509.9 ml  Output 700 ml  Net 1809.9 ml      Assessment/Plan:  74 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 Plan to repeat CTA tomorrow a.m..   Penne BROCKS. Sheree, MD [facility] [place] Office: [phone] Pager: [phone]  "

## 2024-08-21 ENCOUNTER — Inpatient Hospital Stay (HOSPITAL_COMMUNITY)

## 2024-08-21 DIAGNOSIS — Z720 Tobacco use: Secondary | ICD-10-CM

## 2024-08-21 DIAGNOSIS — I71012 Dissection of descending thoracic aorta: Secondary | ICD-10-CM | POA: Diagnosis not present

## 2024-08-21 DIAGNOSIS — R918 Other nonspecific abnormal finding of lung field: Secondary | ICD-10-CM | POA: Diagnosis not present

## 2024-08-21 DIAGNOSIS — J439 Emphysema, unspecified: Secondary | ICD-10-CM | POA: Diagnosis not present

## 2024-08-21 LAB — CBC
HCT: 34.8 % — ABNORMAL LOW (ref 36.0–46.0)
Hemoglobin: 10.9 g/dL — ABNORMAL LOW (ref 12.0–15.0)
MCH: 28.4 pg (ref 26.0–34.0)
MCHC: 31.3 g/dL (ref 30.0–36.0)
MCV: 90.6 fL (ref 80.0–100.0)
Platelets: 342 K/uL (ref 150–400)
RBC: 3.84 MIL/uL — ABNORMAL LOW (ref 3.87–5.11)
RDW: 13.4 % (ref 11.5–15.5)
WBC: 14.7 K/uL — ABNORMAL HIGH (ref 4.0–10.5)
nRBC: 0 % (ref 0.0–0.2)

## 2024-08-21 LAB — BASIC METABOLIC PANEL WITH GFR
Anion gap: 8 (ref 5–15)
BUN: 9 mg/dL (ref 8–23)
CO2: 31 mmol/L (ref 22–32)
Calcium: 8.8 mg/dL — ABNORMAL LOW (ref 8.9–10.3)
Chloride: 96 mmol/L — ABNORMAL LOW (ref 98–111)
Creatinine, Ser: 0.65 mg/dL (ref 0.44–1.00)
GFR, Estimated: >60 mL/min
Glucose, Bld: 110 mg/dL — ABNORMAL HIGH (ref 70–99)
Potassium: 4.3 mmol/L (ref 3.5–5.1)
Sodium: 135 mmol/L (ref 135–145)

## 2024-08-21 LAB — LIPID PANEL
Cholesterol: 178 mg/dL (ref 0–200)
HDL: 46 mg/dL
LDL Cholesterol: 119 mg/dL — ABNORMAL HIGH (ref 0–99)
Total CHOL/HDL Ratio: 3.9 ratio
Triglycerides: 64 mg/dL
VLDL: 13 mg/dL (ref 0–40)

## 2024-08-21 LAB — HEMOGLOBIN A1C
Hgb A1c MFr Bld: 5.8 % — ABNORMAL HIGH (ref 4.8–5.6)
Mean Plasma Glucose: 119.76 mg/dL

## 2024-08-21 LAB — MAGNESIUM: Magnesium: 2 mg/dL (ref 1.7–2.4)

## 2024-08-21 MED ORDER — LABETALOL HCL 5 MG/ML IV SOLN
20.0000 mg | INTRAVENOUS | Status: AC | PRN
  Administered 2024-08-22 – 2024-08-31 (×10): 20 mg via INTRAVENOUS
  Filled 2024-08-21 (×9): qty 4

## 2024-08-21 MED ORDER — QUETIAPINE FUMARATE 25 MG PO TABS
25.0000 mg | ORAL_TABLET | Freq: Every evening | ORAL | Status: DC | PRN
  Administered 2024-08-21 – 2024-08-23 (×2): 25 mg via ORAL
  Filled 2024-08-21 (×2): qty 1

## 2024-08-21 MED ORDER — IOHEXOL 350 MG/ML SOLN
100.0000 mL | Freq: Once | INTRAVENOUS | Status: AC | PRN
  Administered 2024-08-21: 100 mL via INTRAVENOUS

## 2024-08-21 MED ORDER — QUETIAPINE FUMARATE 25 MG PO TABS: 25.0000 mg | ORAL_TABLET | Freq: Every day | ORAL | Status: DC

## 2024-08-21 MED ORDER — NICOTINE 21 MG/24HR TD PT24
21.0000 mg | MEDICATED_PATCH | Freq: Every day | TRANSDERMAL | Status: DC
  Administered 2024-08-21 – 2024-09-02 (×13): 21 mg via TRANSDERMAL
  Filled 2024-08-21 (×13): qty 1

## 2024-08-21 MED ORDER — TRAZODONE HCL 50 MG PO TABS: 50.0000 mg | ORAL_TABLET | Freq: Every evening | ORAL | Status: DC | PRN

## 2024-08-21 MED ORDER — METOPROLOL TARTRATE 50 MG PO TABS
100.0000 mg | ORAL_TABLET | Freq: Two times a day (BID) | ORAL | Status: DC
  Administered 2024-08-22: 100 mg via ORAL
  Filled 2024-08-21 (×2): qty 2

## 2024-08-21 NOTE — Progress Notes (Signed)
 "  NAME:  Alexis Mcclain, MRN:  [id number], DOB:  [birth year], LOS: 2 ADMISSION DATE:  08/19/2024, CONSULTATION DATE:  08/19/2024 REFERRING MD:  Theadore, MD, CHIEF COMPLAINT:  chest pain 2:45 am   History of Present Illness:  A 74 yr old female patient with emphysema, and tobacco smoking (63 PY) on Albuterol  PRN, who awoke up at 2:45 am with severe sharp chest pain radiating to the mid back with SOB. She has chronic smoker cough and mild LL edema (stopped lasix one year ago). No f/c/r, N/V, abd pain, syncope, wheezing, or hemoptysis. No alcohol or illicit drug use. No anti-PLT or AC meds. EMS gave her: 2 nito and 4 baby asa. Initial BP 212/108, then dropped to 134/82.  Pertinent  Medical History  COPD (emphysema), bladder Ca with tumor removal and chemoRx 2019.    Significant Hospital Events: Including procedures, antibiotic start and stop dates in addition to other pertinent events   1/20 admitted. ECHO LVEF 60-65%, RV fxn nml. Gd I diastolic dysfxn 1/21 still on esmolol  gtt. Still having some pain.  1/22 severe CP overnight, stat CT with stable dissection  Interim History / Subjective:  Remains on Esmolol , pain resolved this morning and CT is stable   Objective    Blood pressure (!) 99/50, pulse 72, temperature 97.7 F (36.5 C), temperature source Oral, resp. rate 17, height 5' 2 (1.575 m), weight 71 kg, SpO2 93%.        Intake/Output Summary (Last 24 hours) at 08/21/2024 0932 Last data filed at 08/21/2024 0800 Gross per 24 hour  Intake 755.63 ml  Output 150 ml  Net 605.63 ml   Filed Weights   08/19/24 0401 08/20/24 0500 08/21/24 0500  Weight: 58.1 kg 67.9 kg 71 kg    General:  elderly F resting in bed in NAD  HEENT: MM pink/moist Neuro: alert and oriented without focal deficits  CV: s1s2 rrr, no m/r/g PULM:  clear bilaterally on 4L GI: soft, non-tender Extremities: warm/dry, no edema, palpable pulses    Resolved problem list   Assessment and Plan  Acute type B thoracic  aortic intramural hematoma Plan -Cont esmolol  gtt goal SBP <120 goal HR < 70  -remains on max esmolol , Increase metoprolol   from 50mg  to 100mg  bid -Cont PRN analgesia  -Heart healthy diet  -per vascular will need repeat CTA in 4-6 weeks  Emphysema w/ on-going tobacco abuse;  1.5ppd smoker  On 4L home O2 -Duoneb PRN -Outpatient PFT -I/S -Tobacco smoking cessation counseling   New 1.4 x 1.0 cm flat ground-glass left lower lobe nodule and 3 mm right upper lobe pulmonary nodule -F/u LDCT 6-12 months to confirm persistence, then annual LDCT -Will set her up in office for OP follow up   2.4 cm left adrenal nodule, previously 1.7 cm, indeterminate & 1.3cm left renal lesion  -will need Renal protocol MRI after discharge   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  Critical care was necessary to treat or prevent imminent or life-threatening deterioration.  Critical care was time spent personally by me on the following activities: development of treatment plan with patient and/or surrogate as well as nursing, discussions with consultants, evaluation of patient's response to treatment, examination of patient, obtaining history from patient or surrogate, ordering and performing treatments  and interventions, ordering and review of laboratory studies, ordering and review of radiographic studies, pulse oximetry and re-evaluation of patient's condition.   Alexis SAUNDERS Alexis Ellingson, PA-C [place] [facility] See Amion for pager If no response to pager , please call [phone] until 7pm After 7:00 pm call Elink  [phone]        "

## 2024-08-21 NOTE — Plan of Care (Signed)
" °  Problem: Education: Goal: Knowledge of General Education information will improve Description: Including pain rating scale, medication(s)/side effects and non-pharmacologic comfort measures Outcome: Progressing   Problem: Clinical Measurements: Goal: Ability to maintain clinical measurements within normal limits will improve Outcome: Progressing   Problem: Activity: Goal: Risk for activity intolerance will decrease Outcome: Not Progressing   Problem: Nutrition: Goal: Adequate nutrition will be maintained Outcome: Not Progressing   Problem: Coping: Goal: Level of anxiety will decrease Outcome: Not Progressing   "

## 2024-08-21 NOTE — Progress Notes (Addendum)
" °  Progress Note    08/21/2024 7:55 AM * No surgery found *  Subjective: She had crushing chest pain this morning at 630 so a stat CTA was ordered.  She has just come back from her CT scan and says that her chest pain has eased up    Vitals:   08/21/24 0600 08/21/24 0700  BP: 118/61   Pulse: 71   Resp: 20   Temp:  97.7 F (36.5 C)  SpO2: 91%     Physical Exam: General: Sitting up in bed, NAD Cardiac: Regular rate and rhythm, SBP <120 Lungs: Nonlabored Extremities: Palpable DP pulses bilaterally  CBC    Component Value Date/Time   WBC 14.7 (H) 08/21/2024 0234   RBC 3.84 (L) 08/21/2024 0234   HGB 10.9 (L) 08/21/2024 0234   HGB 10.8 (L) 01/02/2018 0920   HCT 34.8 (L) 08/21/2024 0234   PLT 342 08/21/2024 0234   PLT 604 (H) 01/02/2018 0920   MCV 90.6 08/21/2024 0234   MCH 28.4 08/21/2024 0234   MCHC 31.3 08/21/2024 0234   RDW 13.4 08/21/2024 0234   LYMPHSABS 2.1 01/02/2018 0920   MONOABS 0.8 01/02/2018 0920   EOSABS 0.1 01/02/2018 0920   BASOSABS 0.0 01/02/2018 0920    BMET    Component Value Date/Time   NA 135 08/21/2024 0234   K 4.3 08/21/2024 0234   CL 96 (L) 08/21/2024 0234   CO2 31 08/21/2024 0234   GLUCOSE 110 (H) 08/21/2024 0234   BUN 9 08/21/2024 0234   CREATININE 0.65 08/21/2024 0234   CREATININE 0.81 01/02/2018 0920   CALCIUM 8.8 (L) 08/21/2024 0234   GFRNONAA >60 08/21/2024 0234   GFRNONAA >60 01/02/2018 0920   GFRAA >60 01/02/2018 0920    INR    Component Value Date/Time   INR 1.0 08/19/2024 0910     Intake/Output Summary (Last 24 hours) at 08/21/2024 0755 Last data filed at 08/21/2024 0600 Gross per 24 hour  Intake 850.16 ml  Output 150 ml  Net 700.16 ml      Assessment/Plan:  74 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  She has just come back from her scan and says that her chest pain has now eased up -She has had good impulse control with HR less than 80  and SBP less than 120 -Bilateral lower extremities remain well-perfused with palpable DP pulses -Pending CTA results from this morning.  Continue impulse control   Ahmed Holster, [place] [facility] [phone] 08/21/2024 7:55 AM  I have independently interviewed and examined patient and agree with PA assessment and plan above.  CTA appears satisfactory mostly unchanged and her pain is now well-controlled.  As long as her pain remains well-controlled we will plan for outpatient repeat CTA in 4 to 6 weeks.  Trayvond Viets C. Sheree, MD [facility] [place] Office: [phone] Pager: [phone]      "

## 2024-08-22 DIAGNOSIS — I71012 Dissection of descending thoracic aorta: Secondary | ICD-10-CM | POA: Diagnosis not present

## 2024-08-22 DIAGNOSIS — Z720 Tobacco use: Secondary | ICD-10-CM | POA: Diagnosis not present

## 2024-08-22 DIAGNOSIS — J439 Emphysema, unspecified: Secondary | ICD-10-CM | POA: Diagnosis not present

## 2024-08-22 DIAGNOSIS — R918 Other nonspecific abnormal finding of lung field: Secondary | ICD-10-CM | POA: Diagnosis not present

## 2024-08-22 LAB — BASIC METABOLIC PANEL WITH GFR
Anion gap: 10 (ref 5–15)
BUN: 10 mg/dL (ref 8–23)
CO2: 26 mmol/L (ref 22–32)
Calcium: 9.1 mg/dL (ref 8.9–10.3)
Chloride: 98 mmol/L (ref 98–111)
Creatinine, Ser: 0.63 mg/dL (ref 0.44–1.00)
GFR, Estimated: >60 mL/min
Glucose, Bld: 134 mg/dL — ABNORMAL HIGH (ref 70–99)
Potassium: 4.1 mmol/L (ref 3.5–5.1)
Sodium: 134 mmol/L — ABNORMAL LOW (ref 135–145)

## 2024-08-22 LAB — CBC
HCT: 34.3 % — ABNORMAL LOW (ref 36.0–46.0)
Hemoglobin: 11.2 g/dL — ABNORMAL LOW (ref 12.0–15.0)
MCH: 28.4 pg (ref 26.0–34.0)
MCHC: 32.7 g/dL (ref 30.0–36.0)
MCV: 87.1 fL (ref 80.0–100.0)
Platelets: 370 K/uL (ref 150–400)
RBC: 3.94 MIL/uL (ref 3.87–5.11)
RDW: 13.2 % (ref 11.5–15.5)
WBC: 14.5 K/uL — ABNORMAL HIGH (ref 4.0–10.5)
nRBC: 0 % (ref 0.0–0.2)

## 2024-08-22 LAB — GLUCOSE, CAPILLARY
Glucose-Capillary: 116 mg/dL — ABNORMAL HIGH (ref 70–99)
Glucose-Capillary: 132 mg/dL — ABNORMAL HIGH (ref 70–99)

## 2024-08-22 MED ORDER — THIAMINE MONONITRATE 100 MG PO TABS
100.0000 mg | ORAL_TABLET | Freq: Every day | ORAL | Status: DC
  Administered 2024-08-22 – 2024-08-27 (×6): 100 mg
  Filled 2024-08-22 (×6): qty 1

## 2024-08-22 MED ORDER — HALOPERIDOL LACTATE 5 MG/ML IJ SOLN
2.0000 mg | Freq: Once | INTRAMUSCULAR | Status: AC
  Administered 2024-08-22: 2 mg via INTRAVENOUS
  Filled 2024-08-22: qty 1

## 2024-08-22 MED ORDER — DILTIAZEM HCL 30 MG PO TABS: 30.0000 mg | ORAL_TABLET | Freq: Four times a day (QID) | ORAL | Status: DC

## 2024-08-22 MED ORDER — POLYETHYLENE GLYCOL 3350 17 G PO PACK
17.0000 g | PACK | Freq: Every day | ORAL | Status: DC
  Administered 2024-08-23 – 2024-08-27 (×5): 17 g
  Filled 2024-08-22 (×5): qty 1

## 2024-08-22 MED ORDER — SODIUM CHLORIDE 0.9% FLUSH: 10.0000 mL | INTRAVENOUS | Status: DC | PRN

## 2024-08-22 MED ORDER — SODIUM CHLORIDE 0.9% FLUSH
10.0000 mL | Freq: Two times a day (BID) | INTRAVENOUS | Status: DC
  Administered 2024-08-22 – 2024-08-25 (×6): 10 mL
  Administered 2024-08-26: 20 mL
  Administered 2024-08-26: 3 mL
  Administered 2024-08-27 – 2024-08-28 (×3): 20 mL
  Administered 2024-08-29 – 2024-09-01 (×7): 10 mL

## 2024-08-22 MED ORDER — FUROSEMIDE 10 MG/ML IJ SOLN
40.0000 mg | Freq: Once | INTRAMUSCULAR | Status: AC
  Administered 2024-08-23: 40 mg via INTRAVENOUS
  Filled 2024-08-22: qty 4

## 2024-08-22 MED ORDER — VITAL AF 1.2 CAL PO LIQD
1000.0000 mL | ORAL | Status: DC
  Administered 2024-08-22 – 2024-08-23 (×4): 1000 mL

## 2024-08-22 MED ORDER — DEXMEDETOMIDINE HCL IN NACL 400 MCG/100ML IV SOLN
0.0000 ug/kg/h | INTRAVENOUS | Status: DC
  Administered 2024-08-22: 0.8 ug/kg/h via INTRAVENOUS
  Administered 2024-08-22: 0.4 ug/kg/h via INTRAVENOUS
  Administered 2024-08-22: 0.7 ug/kg/h via INTRAVENOUS
  Administered 2024-08-23 (×2): 0.8 ug/kg/h via INTRAVENOUS
  Filled 2024-08-22 (×5): qty 100

## 2024-08-22 MED ORDER — METOPROLOL TARTRATE 50 MG PO TABS
100.0000 mg | ORAL_TABLET | Freq: Two times a day (BID) | ORAL | Status: DC
  Administered 2024-08-22 – 2024-08-24 (×4): 100 mg
  Filled 2024-08-22 (×5): qty 2

## 2024-08-22 MED ORDER — DOCUSATE SODIUM 50 MG/5ML PO LIQD
100.0000 mg | Freq: Every day | ORAL | Status: DC
  Administered 2024-08-22 – 2024-08-25 (×4): 100 mg
  Filled 2024-08-22 (×4): qty 10

## 2024-08-22 MED ORDER — VITAL AF 1.2 CAL PO LIQD: 1000.0000 mL | ORAL | Status: DC

## 2024-08-22 MED ORDER — CLEVIDIPINE BUTYRATE 0.5 MG/ML IV EMUL
0.0000 mg/h | INTRAVENOUS | Status: DC
  Administered 2024-08-22: 2 mg/h via INTRAVENOUS
  Filled 2024-08-22 (×2): qty 100

## 2024-08-22 MED ORDER — DILTIAZEM HCL 30 MG PO TABS
30.0000 mg | ORAL_TABLET | Freq: Four times a day (QID) | ORAL | Status: DC
  Administered 2024-08-22 – 2024-08-23 (×5): 30 mg
  Filled 2024-08-22 (×5): qty 1

## 2024-08-22 NOTE — Progress Notes (Addendum)
 Controlled  NAME:  Alexis Mcclain, MRN:  [id number], DOB:  20-Nov-1950, LOS: 3 ADMISSION DATE:  08/19/2024, CONSULTATION DATE:  08/19/2024 REFERRING MD:  Theadore, MD, CHIEF COMPLAINT:  chest pain 2:45 am   History of Present Illness:  A 74 yr old female patient with emphysema, and tobacco smoking (63 PY) on Albuterol  PRN, who awoke up at 2:45 am with severe sharp chest pain radiating to the mid back with SOB. She has chronic smoker cough and mild LL edema (stopped lasix  one year ago). No f/c/r, N/V, abd pain, syncope, wheezing, or hemoptysis. No alcohol or illicit drug use. No anti-PLT or AC meds. EMS gave her: 2 nito and 4 baby asa. Initial BP 212/108, then dropped to 134/82.  Pertinent  Medical History  COPD (emphysema), bladder Ca with tumor removal and chemoRx 2019.    Significant Hospital Events: Including procedures, antibiotic start and stop dates in addition to other pertinent events   1/20 admitted. ECHO LVEF 60-65%, RV fxn nml. Gd I diastolic dysfxn 1/21 still on esmolol  gtt. Still having some pain.  1/22 severe CP overnight, stat CT with stable dissection 1/23 agitated and received Haldol , started precedex    Interim History / Subjective:  On max esmolol , agitation improved with Precedex , core trak placed  Start diltiazem  po  Objective    Blood pressure (!) 133/57, pulse 76, temperature 98.4 F (36.9 C), temperature source Axillary, resp. rate 19, height 5' 2 (1.575 m), weight 71.1 kg, SpO2 96%.        Intake/Output Summary (Last 24 hours) at 08/22/2024 1428 Last data filed at 08/22/2024 0900 Gross per 24 hour  Intake 969.07 ml  Output 300 ml  Net 669.07 ml   Filed Weights   08/20/24 0500 08/21/24 0500 08/22/24 0500  Weight: 67.9 kg 71 kg 71.1 kg    General: Elderly female,  hallucinating and res.tless  HEENT: MM pink/moist, sclera anicteric  Neuro: alert, disoriented and restless CV: s1s2 rrr, no m/r/g PULM:  clear bilaterally without tachypnea or distress GI:  soft, non-tender  Extremities: warm/dry, no edema, 2+ pulses       Resolved problem list   Assessment and Plan    Acute type B thoracic aortic intramural hematoma -Cont esmolol  gtt goal SBP <120 goal HR < 70  -remains on max esmolol , Increase metoprolol   from 50mg  to 100mg  bid -add po diltiazem  -Cont PRN analgesia  -per vascular will need repeat CTA in 4-6 weeks -echo with preserved EF and no RHF  Acute ICU Delirium -given Haldol  overnight then precedex  with improvement  -exam is non-focal -delirium precautions, hold trazodone     Emphysema w/ on-going tobacco abuse;  1.5ppd smoker  On 4L home O2 -Duoneb PRN -Outpatient PFT -I/S. 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  Critical care was necessary to treat or prevent imminent or life-threatening deterioration.  Critical care was time spent personally by me on the following activities: development of treatment plan with patient and/or surrogate as well as nursing, discussions with consultants, evaluation of patient's response to treatment, examination of patient, obtaining history from patient or surrogate, ordering and performing treatments and interventions, ordering and review  of laboratory studies, ordering and review of radiographic studies, pulse oximetry and re-evaluation of patient's condition.   Alexis Mcclain Alexis Pawley, PA-C [facility] See Amion for pager If no response to pager , please call [phone] until 7pm

## 2024-08-22 NOTE — Procedures (Signed)
 Cortrak  Person Inserting Tube:  Casia Corti, Olivia SAUNDERS, RD Tube Type:  Cortrak - 43 inches Tube Size:  10 Tube Location:  Left nare Secured by: Bridle Initial Placement:  Gastric Technique Used to Measure Tube Placement:  Marking at nare/corner of mouth Cortrak Secured At:  64 cm Initial Placement Verification:  Cortrak device (Registered Dieticians Only)  Cortrak Tube Team Note:  Consult received to place a Cortrak feeding tube.   No x-ray is required. RN may begin using tube.   If the tube becomes dislodged please keep the tube and contact the Cortrak team at [email or web address] for replacement.  If after hours and replacement cannot be delayed, place a NG tube and confirm placement with an abdominal x-ray.    Olivia Kenning, RD Registered Dietitian  See Amion for more information

## 2024-08-22 NOTE — Progress Notes (Addendum)
" °  Progress Note    08/22/2024 7:29 AM * No surgery found *  Subjective:  reports feeling uncomfortable overnight with some positional back pain    Vitals:   08/22/24 0645 08/22/24 0700  BP: 102/62 117/62  Pulse: 72 72  Resp: 16 16  Temp:    SpO2: 92% 91%    Physical Exam: General:  sitting up in bed, NAD Cardiac:  regular rate and rhythm, SBP <120 Extremities:  palpable DP pulses bilaterally  CBC    Component Value Date/Time   WBC 14.5 (H) 08/22/2024 0640   RBC 3.94 08/22/2024 0640   HGB 11.2 (L) 08/22/2024 0640   HGB 10.8 (L) 01/02/2018 0920   HCT 34.3 (L) 08/22/2024 0640   PLT 370 08/22/2024 0640   PLT 604 (H) 01/02/2018 0920   MCV 87.1 08/22/2024 0640   MCH 28.4 08/22/2024 0640   MCHC 32.7 08/22/2024 0640   RDW 13.2 08/22/2024 0640   LYMPHSABS 2.1 01/02/2018 0920   MONOABS 0.8 01/02/2018 0920   EOSABS 0.1 01/02/2018 0920   BASOSABS 0.0 01/02/2018 0920    BMET    Component Value Date/Time   NA 134 (L) 08/22/2024 0640   K 4.1 08/22/2024 0640   CL 98 08/22/2024 0640   CO2 26 08/22/2024 0640   GLUCOSE 134 (H) 08/22/2024 0640   BUN 10 08/22/2024 0640   CREATININE 0.63 08/22/2024 0640   CREATININE 0.81 01/02/2018 0920   CALCIUM 9.1 08/22/2024 0640   GFRNONAA >60 08/22/2024 0640   GFRNONAA >60 01/02/2018 0920   GFRAA >60 01/02/2018 0920    INR    Component Value Date/Time   INR 1.0 08/19/2024 0910     Intake/Output Summary (Last 24 hours) at 08/22/2024 0729 Last data filed at 08/22/2024 0700 Gross per 24 hour  Intake 1256.27 ml  Output 300 ml  Net 956.27 ml      Assessment/Plan:  74 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 She does report some back pain that sounds musculoskeletal and positional in nature -Good BP and HR control, still on esmolol  drip -BLE remain well perfused with palpable DP pulses -Will plan for outpatient follow up in 4-6 wks with repeat CTA as  long as pain remains well controlled  Ahmed Holster, PA-C [facility] [phone] 08/22/2024 7:29 AM  I have independently interviewed and examined patient and agree with PA assessment and plan above.  She is somewhat confused today however pain appears well-controlled and her blood pressure and heart rate have been well-controlled albeit on esmolol  drip.  Plan will be as above  Amarisa Wilinski C. Sheree, MD [facility] [place] Office: [phone] Pager: [phone]  "

## 2024-08-22 NOTE — Progress Notes (Signed)
 Initial Nutrition Assessment  DOCUMENTATION CODES:   Not applicable  INTERVENTION:   Tube Feeding via Cortrak:  Vital AF at 55 ml/hr Begin TF at rate of 20 ml/hr, titrate by 10 mL q 8 hours until goal rate of 55 ml/hr TF at goal provides 1584 kcals, 99 g of protein and 1069 mL of free water  Receiving additional lipid calories via Cleviprex  infusion  Free Water flush of 150 mL q 6 hours recommended to meet hydration needs if pt continues to be unable to take po hydration  If continues without BM post TF initiation/titration, recommend adjusting bowel regimen  Add Thiamine  100 mg daily x 7 days   NUTRITION DIAGNOSIS:   Inadequate oral intake related to acute illness as evidenced by NPO status.  GOAL:   Patient will meet greater than or equal to 90% of their needs  MONITOR:   PO intake, TF tolerance, Labs, Weight trends  REASON FOR ASSESSMENT:   Consult Enteral/tube feeding initiation and management, Assessment of nutrition requirement/status  ASSESSMENT:   74 yo female admitted with acute type B thoracic intramural hematoma-being medically managed at present. PMH includes COPD with ongoing tobacco abuse, hx of bladder cancer with tumor removal and chemo in 2019  1/20 Admitted, CTA A/P:  acute type B thoracic aortic intramural hematoma, cirrhotic changes of liver with mild steatosis, +LLL and RUL lung nodules, L renal lesion, L adrenal lesion 1/22 Repeat CT with stable type B aortic dissection  Pt currently on precedex , worsening confusion over the last 24 hours, agitated overnight, currently sleeping  Pt remains on esmolol  and cleviprex  gtt  Pt on Heart Healthy diet, eating minimally since admission, unable to take po today due to AMS. Recommend meal trays be held until pt alert enough to safely take po  Family at bedside; pt with good appetite PTA but report pt is not a big eater, just nibbles throughout the day. Pt lives alone; resides in a duplex and family  lives on the other side of the duplex and checks in on pt regularly. Pt independent with ADLs.  Pt is a current smoker (tobacco) but denies EtOH use-family reports the same  No BM since admission; currently on scheduled bowel regimen  Current height of 5'2 tall. Family reports pt is actually 5'4 to 5'5 tall; Plan to correct in chart  Labs: Sodium 134 (L) Potassium 4.1 (wdl) BUN/Creatinine wdl Phosphorus 2.5 (wdl)-1/21 Magnesium  2.0 (wdl)-1/22  Meds: Colace Miralax  Precedex  gtt Colace daily Senna prn   NUTRITION - FOCUSED PHYSICAL EXAM: Limited exam due to pt mental status  Flowsheet Row Most Recent Value  Orbital Region No depletion  Upper Arm Region Unable to assess  Thoracic and Lumbar Region Unable to assess  Buccal Region No depletion  Temple Region No depletion  Clavicle Bone Region Mild depletion  Clavicle and Acromion Bone Region Mild depletion  Scapular Bone Region Mild depletion  Dorsal Hand Unable to assess  Patellar Region Mild depletion  Anterior Thigh Region Mild depletion  Posterior Calf Region Mild depletion  Edema (RD Assessment) None    Diet Order:   Diet Order             Diet Heart Room service appropriate? Yes; Fluid consistency: Thin  Diet effective now                   EDUCATION NEEDS:   Education needs have been addressed (family education, pt altered)  Skin:  Skin Assessment: Reviewed RN Assessment  Last  BM:  PTA, +flatus, +BS  Height:   Ht Readings from Last 1 Encounters:  08/22/24 5' 4 (1.626 m)    Weight:   Wt Readings from Last 1 Encounters:  08/22/24 71.1 kg    BMI:  Body mass index is 26.91 kg/m.  Estimated Nutritional Needs:   Kcal:  1600-1800 kcals  Protein:  75-95 g  Fluid:  >/= 1.6 L   Betsey Finger MS, RDN, LDN, CNSC Registered Dietitian 3 Clinical Nutrition RD Inpatient Contact Info in Amion

## 2024-08-23 ENCOUNTER — Telehealth: Payer: Self-pay | Admitting: Critical Care Medicine

## 2024-08-23 DIAGNOSIS — K7460 Unspecified cirrhosis of liver: Secondary | ICD-10-CM | POA: Diagnosis not present

## 2024-08-23 DIAGNOSIS — R739 Hyperglycemia, unspecified: Secondary | ICD-10-CM

## 2024-08-23 DIAGNOSIS — E279 Disorder of adrenal gland, unspecified: Secondary | ICD-10-CM | POA: Diagnosis not present

## 2024-08-23 DIAGNOSIS — I71012 Dissection of descending thoracic aorta: Secondary | ICD-10-CM | POA: Diagnosis not present

## 2024-08-23 DIAGNOSIS — R911 Solitary pulmonary nodule: Secondary | ICD-10-CM | POA: Diagnosis not present

## 2024-08-23 DIAGNOSIS — G9340 Encephalopathy, unspecified: Secondary | ICD-10-CM | POA: Diagnosis not present

## 2024-08-23 DIAGNOSIS — N289 Disorder of kidney and ureter, unspecified: Secondary | ICD-10-CM | POA: Diagnosis not present

## 2024-08-23 DIAGNOSIS — J9621 Acute and chronic respiratory failure with hypoxia: Secondary | ICD-10-CM

## 2024-08-23 DIAGNOSIS — K5900 Constipation, unspecified: Secondary | ICD-10-CM | POA: Diagnosis not present

## 2024-08-23 DIAGNOSIS — J439 Emphysema, unspecified: Secondary | ICD-10-CM | POA: Diagnosis not present

## 2024-08-23 LAB — GLUCOSE, CAPILLARY
Glucose-Capillary: 135 mg/dL — ABNORMAL HIGH (ref 70–99)
Glucose-Capillary: 138 mg/dL — ABNORMAL HIGH (ref 70–99)
Glucose-Capillary: 146 mg/dL — ABNORMAL HIGH (ref 70–99)
Glucose-Capillary: 118 mg/dL — ABNORMAL HIGH (ref 70–99)
Glucose-Capillary: 166 mg/dL — ABNORMAL HIGH (ref 70–99)
Glucose-Capillary: 161 mg/dL — ABNORMAL HIGH (ref 70–99)

## 2024-08-23 LAB — CBC
HCT: 37.1 % (ref 36.0–46.0)
Hemoglobin: 11.9 g/dL — ABNORMAL LOW (ref 12.0–15.0)
MCH: 28.2 pg (ref 26.0–34.0)
MCHC: 32.1 g/dL (ref 30.0–36.0)
MCV: 87.9 fL (ref 80.0–100.0)
Platelets: 416 10*3/uL — ABNORMAL HIGH (ref 150–400)
RBC: 4.22 MIL/uL (ref 3.87–5.11)
RDW: 13.2 % (ref 11.5–15.5)
WBC: 15.3 10*3/uL — ABNORMAL HIGH (ref 4.0–10.5)
nRBC: 0 % (ref 0.0–0.2)

## 2024-08-23 LAB — RENAL FUNCTION PANEL
Albumin: 3.1 g/dL — ABNORMAL LOW (ref 3.5–5.0)
Anion gap: 9 (ref 5–15)
BUN: 11 mg/dL (ref 8–23)
CO2: 30 mmol/L (ref 22–32)
Calcium: 9.2 mg/dL (ref 8.9–10.3)
Chloride: 97 mmol/L — ABNORMAL LOW (ref 98–111)
Creatinine, Ser: 0.7 mg/dL (ref 0.44–1.00)
GFR, Estimated: >60 mL/min
Glucose, Bld: 125 mg/dL — ABNORMAL HIGH (ref 70–99)
Phosphorus: 3 mg/dL (ref 2.5–4.6)
Potassium: 3.9 mmol/L (ref 3.5–5.1)
Sodium: 137 mmol/L (ref 135–145)

## 2024-08-23 LAB — MAGNESIUM: Magnesium: 1.6 mg/dL — ABNORMAL LOW (ref 1.7–2.4)

## 2024-08-23 MED ORDER — DILTIAZEM HCL 30 MG PO TABS
90.0000 mg | ORAL_TABLET | Freq: Four times a day (QID) | ORAL | Status: DC
  Administered 2024-08-23 – 2024-08-25 (×6): 90 mg
  Filled 2024-08-23 (×5): qty 3

## 2024-08-23 MED ORDER — FUROSEMIDE 10 MG/ML IJ SOLN
80.0000 mg | Freq: Once | INTRAMUSCULAR | Status: AC
  Administered 2024-08-23: 80 mg via INTRAVENOUS
  Filled 2024-08-23: qty 8

## 2024-08-23 MED ORDER — SODIUM CHLORIDE 3 % IN NEBU
4.0000 mL | INHALATION_SOLUTION | Freq: Every day | RESPIRATORY_TRACT | Status: AC
  Administered 2024-08-23 – 2024-08-24 (×2): 4 mL via RESPIRATORY_TRACT
  Filled 2024-08-23 (×3): qty 4

## 2024-08-23 MED ORDER — POTASSIUM CHLORIDE 20 MEQ PO PACK
40.0000 meq | PACK | Freq: Once | ORAL | Status: AC
  Administered 2024-08-23: 40 meq
  Filled 2024-08-23: qty 2

## 2024-08-23 MED ORDER — REVEFENACIN 175 MCG/3ML IN SOLN
175.0000 ug | Freq: Every day | RESPIRATORY_TRACT | Status: DC
  Administered 2024-08-24 – 2024-08-30 (×5): 175 ug via RESPIRATORY_TRACT
  Filled 2024-08-23 (×7): qty 3

## 2024-08-23 MED ORDER — MAGNESIUM SULFATE 2 GM/50ML IV SOLN
2.0000 g | Freq: Once | INTRAVENOUS | Status: AC
  Administered 2024-08-23: 2 g via INTRAVENOUS
  Filled 2024-08-23: qty 50

## 2024-08-23 MED ORDER — ARFORMOTEROL TARTRATE 15 MCG/2ML IN NEBU
15.0000 ug | INHALATION_SOLUTION | Freq: Two times a day (BID) | RESPIRATORY_TRACT | Status: DC
  Administered 2024-08-23 – 2024-08-30 (×10): 15 ug via RESPIRATORY_TRACT
  Filled 2024-08-23 (×18): qty 2

## 2024-08-23 NOTE — Progress Notes (Signed)
 Controlled  NAME:  Alexis Mcclain, MRN:  [id number], DOB:  January 17, 1951, LOS: 4 ADMISSION DATE:  08/19/2024, CONSULTATION DATE:  08/19/2024 REFERRING MD:  Theadore, MD, CHIEF COMPLAINT:  chest pain 2:45 am   History of Present Illness:  A 74 yr old female patient with emphysema, and tobacco smoking (63 PY) on Albuterol  PRN, who awoke up at 2:45 am with severe sharp chest pain radiating to the mid back with SOB. She has chronic smoker cough and mild LL edema (stopped lasix  one year ago). No f/c/r, N/V, abd pain, syncope, wheezing, or hemoptysis. No alcohol or illicit drug use. No anti-PLT or AC meds. EMS gave her: 2 nito and 4 baby asa. Initial BP 212/108, then dropped to 134/82.  Pertinent  Medical History  COPD (emphysema), bladder Ca with tumor removal and chemoRx 2019.    Significant Hospital Events: Including procedures, antibiotic start and stop dates in addition to other pertinent events   1/20 admitted. ECHO LVEF 60-65%, RV fxn nml. Gd I diastolic dysfxn 1/21 still on esmolol  gtt. Still having some pain.  1/22 severe CP overnight, stat CT with stable dissection 1/23 agitated and received Haldol , started precedex    Interim History / Subjective:  Remained on Precedex  today.  She feels more short of breath.  Has baseline cough with baseline sputum production from smoking.  Objective    Blood pressure (!) 118/52, pulse 61, temperature 98.1 F (36.7 C), temperature source Axillary, resp. rate 20, height 5' 4 (1.626 m), weight 75.4 kg, SpO2 98%.        Intake/Output Summary (Last 24 hours) at 08/23/2024 1545 Last data filed at 08/23/2024 1500 Gross per 24 hour  Intake 2681.92 ml  Output 1750 ml  Net 931.92 ml   Filed Weights   08/21/24 0500 08/22/24 0500 08/23/24 0500  Weight: 71 kg 71.1 kg 75.4 kg    General: Chronically ill-appearing woman sitting up in bed, lethargic HEENT: [place]/AT, eyes anicteric Neuro: Arousable to verbal stimulation, during exam more alert.  Able to answer  some questions and follow simple commands. CV: 1 S2, regular rate and rhythm PULM: Breathing comfortably on [place], reduced basilar breath sounds, wet sounding cough.  No egophony in bases. GI: Soft, nontender Extremities: Warm, no significant edema.  BUN 11 Cr 0.7 WBC 15.3 H/H 11.9/37.1 Platelets  416   Resolved problem list   Assessment and Plan    Acute type B thoracic aortic intramural hematoma - Impulse control with goal systolic blood pressure less than 120 and heart rate goal less than 70, ideally less than 60 - Continue esmolol  infusion - Continue metoprolol  100 mg twice daily - Increasing diltiazem  to 90 mg 3 times daily - DC Precedex  - Pain meds as needed -Repeat CTA in 4 to 6 weeks follow-up with outpatient vascular  Acute encephalopathy due to ICU Delirium - Discontinue Precedex  - Encourage sleep-wake cycle - Delirium precautions  Hyperglycemia-controlled without needing insulin; preDM. A1c 5.8 -Sliding scale insulin as needed - Goal blood glucose 140-180  Acute on chronic respiratory failure with hypoxia Emphysema w/ on-going tobacco abuse;  1.5ppd smoker  On 4L home O2 --Discontinue Precedex , encourage pulmonary hygiene - Hypertonic saline nebs - Flutter valve.  May need to add chest PT - Strongly recommend total cessation of smoking - Add Brovana  and Yupelri   New 1.4 x 1.0 cm flat ground-glass left lower lobe nodule and 3 mm right upper lobe pulmonary nodule - Needs outpatient follow-up surveillance CT scan in 6-12 months to confirm persistence -Will  set her up in office for OP follow up   2.4 cm left adrenal nodule, previously 1.7 cm, indeterminate & 1.3cm left renal lesion  -Will need follow-up MRI as an outpatient  Cirrhotic changes of the liver with mild steatosis - Outpatient follow-up  Nutrition - Core track, continue tube feeds  History of tobacco abuse - Imperative that she quit smoking - Continue nicotine  replacement therapy  Daughter  updated at bedside during rounds     This patient is critically ill with multiple organ system failure which requires frequent high complexity decision making, assessment, support, evaluation, and titration of therapies. This was completed through the application of advanced monitoring technologies and extensive interpretation of multiple databases. During this encounter critical care time was devoted to patient care services described in this note for 36 minutes.  [name], DO 08/23/24 9:54 PM [facility]  For contact information, see Amion. If no response to pager, please call PCCM 2H APP. After hours, 7PM- 7AM, please call on call APP for 2H.

## 2024-08-23 NOTE — Telephone Encounter (Signed)
 OP pulmonary follow up requested in 1-2 months for COPD & pulmonary nodules.  Alexis SHAUNNA Gaskins, DO 08/23/24 9:51 PM [facility]

## 2024-08-24 ENCOUNTER — Inpatient Hospital Stay (HOSPITAL_COMMUNITY)

## 2024-08-24 DIAGNOSIS — K5900 Constipation, unspecified: Secondary | ICD-10-CM

## 2024-08-24 DIAGNOSIS — J439 Emphysema, unspecified: Secondary | ICD-10-CM | POA: Diagnosis not present

## 2024-08-24 DIAGNOSIS — I71012 Dissection of descending thoracic aorta: Secondary | ICD-10-CM | POA: Diagnosis not present

## 2024-08-24 DIAGNOSIS — G9340 Encephalopathy, unspecified: Secondary | ICD-10-CM | POA: Diagnosis not present

## 2024-08-24 DIAGNOSIS — J9621 Acute and chronic respiratory failure with hypoxia: Secondary | ICD-10-CM | POA: Diagnosis not present

## 2024-08-24 LAB — POCT I-STAT EG7
Acid-Base Excess: 6 mmol/L — ABNORMAL HIGH (ref 0.0–2.0)
Bicarbonate: 34 mmol/L — ABNORMAL HIGH (ref 20.0–28.0)
Calcium, Ion: 1.23 mmol/L (ref 1.15–1.40)
HCT: 46 % (ref 36.0–46.0)
Hemoglobin: 15.6 g/dL — ABNORMAL HIGH (ref 12.0–15.0)
O2 Saturation: 63 %
Patient temperature: 98.7
Potassium: 4.5 mmol/L (ref 3.5–5.1)
Sodium: 149 mmol/L — ABNORMAL HIGH (ref 135–145)
TCO2: 36 mmol/L — ABNORMAL HIGH (ref 22–32)
pCO2, Ven: 61 mmHg — ABNORMAL HIGH (ref 44–60)
pH, Ven: 7.355 (ref 7.25–7.43)
pO2, Ven: 35 mmHg (ref 32–45)

## 2024-08-24 LAB — CBC
HCT: 36.2 % (ref 36.0–46.0)
Hemoglobin: 11.8 g/dL — ABNORMAL LOW (ref 12.0–15.0)
MCH: 28.4 pg (ref 26.0–34.0)
MCHC: 32.6 g/dL (ref 30.0–36.0)
MCV: 87.2 fL (ref 80.0–100.0)
Platelets: 493 10*3/uL — ABNORMAL HIGH (ref 150–400)
RBC: 4.15 MIL/uL (ref 3.87–5.11)
RDW: 13.3 % (ref 11.5–15.5)
WBC: 18.1 10*3/uL — ABNORMAL HIGH (ref 4.0–10.5)
nRBC: 0 % (ref 0.0–0.2)

## 2024-08-24 LAB — GLUCOSE, CAPILLARY
Glucose-Capillary: 130 mg/dL — ABNORMAL HIGH (ref 70–99)
Glucose-Capillary: 123 mg/dL — ABNORMAL HIGH (ref 70–99)
Glucose-Capillary: 130 mg/dL — ABNORMAL HIGH (ref 70–99)
Glucose-Capillary: 141 mg/dL — ABNORMAL HIGH (ref 70–99)
Glucose-Capillary: 139 mg/dL — ABNORMAL HIGH (ref 70–99)
Glucose-Capillary: 116 mg/dL — ABNORMAL HIGH (ref 70–99)
Glucose-Capillary: 119 mg/dL — ABNORMAL HIGH (ref 70–99)

## 2024-08-24 LAB — BASIC METABOLIC PANEL WITH GFR
Anion gap: 8 (ref 5–15)
BUN: 21 mg/dL (ref 8–23)
CO2: 31 mmol/L (ref 22–32)
Calcium: 9.2 mg/dL (ref 8.9–10.3)
Chloride: 96 mmol/L — ABNORMAL LOW (ref 98–111)
Creatinine, Ser: 0.77 mg/dL (ref 0.44–1.00)
GFR, Estimated: >60 mL/min
Glucose, Bld: 142 mg/dL — ABNORMAL HIGH (ref 70–99)
Potassium: 3.9 mmol/L (ref 3.5–5.1)
Sodium: 135 mmol/L (ref 135–145)

## 2024-08-24 LAB — AMMONIA: Ammonia: 26 umol/L (ref 9–35)

## 2024-08-24 LAB — PHOSPHORUS: Phosphorus: 3.3 mg/dL (ref 2.5–4.6)

## 2024-08-24 LAB — MAGNESIUM: Magnesium: 1.9 mg/dL (ref 1.7–2.4)

## 2024-08-24 MED ORDER — LOSARTAN POTASSIUM 25 MG PO TABS
25.0000 mg | ORAL_TABLET | Freq: Every day | ORAL | Status: DC
  Administered 2024-08-24: 25 mg via ORAL
  Filled 2024-08-24 (×2): qty 1

## 2024-08-24 MED ORDER — VANCOMYCIN HCL 1250 MG/250ML IV SOLN
1250.0000 mg | INTRAVENOUS | Status: DC
  Administered 2024-08-24 – 2024-08-26 (×3): 1250 mg via INTRAVENOUS
  Filled 2024-08-24 (×3): qty 250

## 2024-08-24 MED ORDER — MAGNESIUM SULFATE 2 GM/50ML IV SOLN
2.0000 g | Freq: Once | INTRAVENOUS | Status: AC
  Administered 2024-08-24: 2 g via INTRAVENOUS
  Filled 2024-08-24: qty 50

## 2024-08-24 MED ORDER — POTASSIUM CHLORIDE 10 MEQ/100ML IV SOLN
10.0000 meq | INTRAVENOUS | Status: AC
  Administered 2024-08-24 (×2): 10 meq via INTRAVENOUS
  Filled 2024-08-24 (×2): qty 100

## 2024-08-24 MED ORDER — POTASSIUM CHLORIDE 20 MEQ PO PACK: 20.0000 meq | PACK | Freq: Once | ORAL | Status: DC

## 2024-08-24 MED ORDER — QUETIAPINE FUMARATE 25 MG PO TABS
12.5000 mg | ORAL_TABLET | Freq: Every evening | ORAL | Status: DC | PRN
  Administered 2024-08-24: 12.5 mg via ORAL
  Filled 2024-08-24: qty 1

## 2024-08-24 MED ORDER — PIPERACILLIN-TAZOBACTAM 3.375 G IVPB
3.3750 g | Freq: Three times a day (TID) | INTRAVENOUS | Status: DC
  Administered 2024-08-24 – 2024-08-27 (×8): 3.375 g via INTRAVENOUS
  Filled 2024-08-24 (×8): qty 50

## 2024-08-24 MED ORDER — BISACODYL 10 MG RE SUPP
10.0000 mg | Freq: Once | RECTAL | Status: AC
  Administered 2024-08-24: 10 mg via RECTAL
  Filled 2024-08-24: qty 1

## 2024-08-24 MED ORDER — POTASSIUM CHLORIDE CRYS ER 20 MEQ PO TBCR: 20.0000 meq | EXTENDED_RELEASE_TABLET | Freq: Once | ORAL | Status: DC

## 2024-08-24 MED ORDER — METOCLOPRAMIDE HCL 5 MG/ML IJ SOLN
5.0000 mg | Freq: Three times a day (TID) | INTRAMUSCULAR | Status: DC
  Administered 2024-08-24 (×2): 5 mg via INTRAVENOUS
  Filled 2024-08-24 (×3): qty 2

## 2024-08-24 NOTE — Progress Notes (Signed)
" °  Progress Note    08/24/2024 9:40 AM * No surgery found *  Subjective: Reports some mild chest pain, on DuoNeb treatment    Vitals:   08/24/24 0915 08/24/24 0930  BP: (!) 112/54 114/63  Pulse: (!) 54 (!) 54  Resp: (!) 22 19  Temp:    SpO2: 95% 97%    Physical Exam: General: No acute distress, on DuoNeb treatment Cardiac:  regular rate and rhythm, SBP <120 Extremities:  palpable DP and radial pulses bilaterally  CBC    Component Value Date/Time   WBC 18.1 (H) 08/24/2024 0111   RBC 4.15 08/24/2024 0111   HGB 11.8 (L) 08/24/2024 0111   HGB 10.8 (L) 01/02/2018 0920   HCT 36.2 08/24/2024 0111   PLT 493 (H) 08/24/2024 0111   PLT 604 (H) 01/02/2018 0920   MCV 87.2 08/24/2024 0111   MCH 28.4 08/24/2024 0111   MCHC 32.6 08/24/2024 0111   RDW 13.3 08/24/2024 0111   LYMPHSABS 2.1 01/02/2018 0920   MONOABS 0.8 01/02/2018 0920   EOSABS 0.1 01/02/2018 0920   BASOSABS 0.0 01/02/2018 0920    BMET    Component Value Date/Time   NA 135 08/24/2024 0111   K 3.9 08/24/2024 0111   CL 96 (L) 08/24/2024 0111   CO2 31 08/24/2024 0111   GLUCOSE 142 (H) 08/24/2024 0111   BUN 21 08/24/2024 0111   CREATININE 0.77 08/24/2024 0111   CREATININE 0.81 01/02/2018 0920   CALCIUM 9.2 08/24/2024 0111   GFRNONAA >60 08/24/2024 0111   GFRNONAA >60 01/02/2018 0920   GFRAA >60 01/02/2018 0920    INR    Component Value Date/Time   INR 1.0 08/19/2024 0910     Intake/Output Summary (Last 24 hours) at 08/24/2024 0940 Last data filed at 08/24/2024 0900 Gross per 24 hour  Intake 1041.21 ml  Output 900 ml  Net 141.21 ml      Assessment/Plan:  74 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  Stable on repeat scan   - She continues to have some mild chest pain but nothing of concern. - On impulse control, continue to attempt to wean esmolol  No current indication for TEVAR  Norman GORMAN Serve MD [facility] Phone Number: [phone] 08/24/2024 9:43  AM     "

## 2024-08-24 NOTE — Plan of Care (Signed)

## 2024-08-24 NOTE — Progress Notes (Signed)
 Pharmacy Antibiotic Note  Alexis Mcclain is a 74 y.o. female admitted on 08/19/2024 with sepsis. Pharmacy has been consulted for vancomycin  and Zosyn  dosing. Cr <1.  Plan: Vancomycin  1250mg  IV q24h - est AUC 457 Zosyn  EI 3.375g q8h Follow Cr, LOT, Cx  Height: 5' 4 (162.6 cm) Weight: 75.4 kg (166 lb 3.6 oz) IBW/kg (Calculated) : 54.7  Temp (24hrs), Avg:97.8 F (36.6 C), Min:97.2 F (36.2 C), Max:98.5 F (36.9 C)  Recent Labs  Lab 08/20/24 0221 08/20/24 0655 08/21/24 0234 08/22/24 0640 08/23/24 0520 08/24/24 0111  WBC 14.7*  --  14.7* 14.5* 15.3* 18.1*  CREATININE  --  0.66 0.65 0.63 0.70 0.77    Estimated Creatinine Clearance: 64.1 mL/min (by C-G formula based on SCr of 0.77 mg/dL).    Allergies[1]    Ozell Jamaica, PharmD, BCPS, [facility] Clinical Pharmacist [phone] Please check AMION for all [facility] Pharmacy numbers 08/24/2024     [1]  Allergies Allergen Reactions   Nickel     Whelps   Tape Other (See Comments)    Burns skin    Hurt when pulled off

## 2024-08-24 NOTE — Progress Notes (Signed)
 Attempted to place NG tube by myself and another RN. Unable to place. Patient is refusing for us  to try again. Cortrak connected to LIWS. Patient informed that this may not work. She agreed for us  to try. Daughters at the bedside and understand as well.

## 2024-08-24 NOTE — Progress Notes (Signed)
 Controlled  NAME:  Alexis Mcclain, MRN:  [id number], DOB:  1951/03/17, LOS: 5 ADMISSION DATE:  08/19/2024, CONSULTATION DATE:  08/19/2024 REFERRING MD:  Theadore, MD, CHIEF COMPLAINT:  chest pain 2:45 am   History of Present Illness:  A 74 yr old female patient with emphysema, and tobacco smoking (63 PY) on Albuterol  PRN, who awoke up at 2:45 am with severe sharp chest pain radiating to the mid back with SOB. She has chronic smoker cough and mild LL edema (stopped lasix  one year ago). No f/c/r, N/V, abd pain, syncope, wheezing, or hemoptysis. No alcohol or illicit drug use. No anti-PLT or AC meds. EMS gave her: 2 nito and 4 baby asa. Initial BP 212/108, then dropped to 134/82.  Pertinent  Medical History  COPD (emphysema), bladder Ca with tumor removal and chemoRx 2019.    Significant Hospital Events: Including procedures, antibiotic start and stop dates in addition to other pertinent events   1/20 admitted. ECHO LVEF 60-65%, RV fxn nml. Gd I diastolic dysfxn 1/21 still on esmolol  gtt. Still having some pain.  1/22 severe CP overnight, stat CT with stable dissection 1/23 agitated and received Haldol , started precedex   12/24 off precedex   Interim History / Subjective:  Still lethargic. Vomited this morning. Now brady overnight with HR in 50s. Remains on low dose esmolol .  Per daughters, not on home O2  Objective    Blood pressure 133/68, pulse (!) 51, temperature (!) 97.4 F (36.3 C), temperature source Axillary, resp. rate (!) 21, height 5' 4 (1.626 m), weight 75.4 kg, SpO2 95%.        Intake/Output Summary (Last 24 hours) at 08/24/2024 1257 Last data filed at 08/24/2024 1200 Gross per 24 hour  Intake 926.32 ml  Output 1300 ml  Net -373.68 ml   Filed Weights   08/21/24 0500 08/22/24 0500 08/23/24 0500  Weight: 71 kg 71.1 kg 75.4 kg    General: chronically ill appearing woman lying in bed in NAD HEENT [place]/AT, eyes anicteric, cortrak Neuro: arousable, with stimulation stays more  awake but overall lethargic. Moving all extremities CV: S1S2, RRR PULM: breathing comfortably on [place], no wheezing GI: soft, NT Extremities: minimal edema  BUN 21 Cr 0.77 WBC 18.1 H/H 11.8/36.2 Platelets  493    Resolved problem list   Assessment and Plan    Acute type B thoracic aortic intramural hematoma - impulse control- goal HR <60, SBP< 120 -wean off esmolol  -con't diltiazem , metoprolol  -starting losartan  -may not tolerate orals with vomiting; will give and clamp NGT -pain meds PRN -Repeat CTA in 4 to 6 weeks follow-up with outpatient vascular  Acute encephalopathy due to ICU Delirium -encouraged sleeping at night and remaining awake during the day -family at bedside -needs to try to mobilize more -PT, OT consult  Hyperglycemia-controlled without needing insulin; preDM. A1c 5.8 -SSI PRN- not needing today -goal BG<180  Acute on chronic respiratory failure with hypoxia Emphysema w/ on-going tobacco abuse;  1.5ppd smoker  On no home O2 per daughters --pulmonary hygiene-- hypertonic nebs, flutter, bed CPT -supplemental O2 as required to maintain spO2 >90% - -recommend total tobacco avoidance -brovana  and yupelri   Vomiting, concern for ileus constipation -KUB, NGT to suction> having to use cortrak b/c she is refusing a new larger NGT -dulcolax -start low dose reglan   Worsening leukocytosis -start empiric antibiotics- possible aspiration. No other clear source of infection.  New 1.4 x 1.0 cm flat ground-glass left lower lobe nodule and 3 mm right upper lobe pulmonary nodule -  Needs outpatient follow-up surveillance CT scan in 6-12 months to confirm persistence - OP follow up requested  2.4 cm left adrenal nodule, previously 1.7 cm, indeterminate & 1.3cm left renal lesion  -Will need follow-up MRI as an outpatient  Cirrhotic changes of the liver with mild steatosis - Outpatient follow-up needed  Nutrition -holding TF due to vomiting  History of  tobacco abuse -needs to quit smoking, con't nicotine  patches  Daughters updated at bedside during rounds.     This patient is critically ill with multiple organ system failure which requires frequent high complexity decision making, assessment, support, evaluation, and titration of therapies. This was completed through the application of advanced monitoring technologies and extensive interpretation of multiple databases. During this encounter critical care time was devoted to patient care services described in this note for 36 minutes.  Alexis SHAUNNA Gaskins, DO 08/24/24 8:03 PM [facility]  For contact information, see Amion. If no response to pager, please call PCCM 2H APP. After hours, 7PM- 7AM, please call on call APP for 2H.

## 2024-08-24 NOTE — Plan of Care (Signed)
   Problem: Clinical Measurements: Goal: Respiratory complications will improve Outcome: Progressing Goal: Cardiovascular complication will be avoided Outcome: Progressing

## 2024-08-25 DIAGNOSIS — J9621 Acute and chronic respiratory failure with hypoxia: Secondary | ICD-10-CM

## 2024-08-25 DIAGNOSIS — K567 Ileus, unspecified: Secondary | ICD-10-CM

## 2024-08-25 DIAGNOSIS — J439 Emphysema, unspecified: Secondary | ICD-10-CM | POA: Diagnosis not present

## 2024-08-25 DIAGNOSIS — E279 Disorder of adrenal gland, unspecified: Secondary | ICD-10-CM | POA: Diagnosis not present

## 2024-08-25 DIAGNOSIS — R911 Solitary pulmonary nodule: Secondary | ICD-10-CM | POA: Diagnosis not present

## 2024-08-25 DIAGNOSIS — J9622 Acute and chronic respiratory failure with hypercapnia: Secondary | ICD-10-CM | POA: Diagnosis not present

## 2024-08-25 DIAGNOSIS — R739 Hyperglycemia, unspecified: Secondary | ICD-10-CM | POA: Diagnosis not present

## 2024-08-25 DIAGNOSIS — G9341 Metabolic encephalopathy: Secondary | ICD-10-CM

## 2024-08-25 DIAGNOSIS — I71012 Dissection of descending thoracic aorta: Secondary | ICD-10-CM | POA: Diagnosis not present

## 2024-08-25 DIAGNOSIS — I161 Hypertensive emergency: Secondary | ICD-10-CM | POA: Diagnosis not present

## 2024-08-25 DIAGNOSIS — K5900 Constipation, unspecified: Secondary | ICD-10-CM | POA: Diagnosis not present

## 2024-08-25 DIAGNOSIS — G9340 Encephalopathy, unspecified: Secondary | ICD-10-CM | POA: Diagnosis not present

## 2024-08-25 DIAGNOSIS — N289 Disorder of kidney and ureter, unspecified: Secondary | ICD-10-CM | POA: Diagnosis not present

## 2024-08-25 DIAGNOSIS — K7460 Unspecified cirrhosis of liver: Secondary | ICD-10-CM | POA: Diagnosis not present

## 2024-08-25 LAB — CBC
HCT: 38.7 % (ref 36.0–46.0)
Hemoglobin: 12 g/dL (ref 12.0–15.0)
MCH: 28 pg (ref 26.0–34.0)
MCHC: 31 g/dL (ref 30.0–36.0)
MCV: 90.4 fL (ref 80.0–100.0)
Platelets: 516 10*3/uL — ABNORMAL HIGH (ref 150–400)
RBC: 4.28 MIL/uL (ref 3.87–5.11)
RDW: 13.4 % (ref 11.5–15.5)
WBC: 16.3 10*3/uL — ABNORMAL HIGH (ref 4.0–10.5)
nRBC: 0 % (ref 0.0–0.2)

## 2024-08-25 LAB — BASIC METABOLIC PANEL WITH GFR
Anion gap: 11 (ref 5–15)
BUN: 33 mg/dL — ABNORMAL HIGH (ref 8–23)
CO2: 30 mmol/L (ref 22–32)
Calcium: 9.1 mg/dL (ref 8.9–10.3)
Chloride: 97 mmol/L — ABNORMAL LOW (ref 98–111)
Creatinine, Ser: 0.97 mg/dL (ref 0.44–1.00)
GFR, Estimated: >60 mL/min
Glucose, Bld: 125 mg/dL — ABNORMAL HIGH (ref 70–99)
Potassium: 4.1 mmol/L (ref 3.5–5.1)
Sodium: 138 mmol/L (ref 135–145)

## 2024-08-25 LAB — PHOSPHORUS: Phosphorus: 3.8 mg/dL (ref 2.5–4.6)

## 2024-08-25 LAB — GLUCOSE, CAPILLARY
Glucose-Capillary: 102 mg/dL — ABNORMAL HIGH (ref 70–99)
Glucose-Capillary: 106 mg/dL — ABNORMAL HIGH (ref 70–99)
Glucose-Capillary: 110 mg/dL — ABNORMAL HIGH (ref 70–99)
Glucose-Capillary: 75 mg/dL (ref 70–99)
Glucose-Capillary: 93 mg/dL (ref 70–99)
Glucose-Capillary: 99 mg/dL (ref 70–99)

## 2024-08-25 LAB — MAGNESIUM: Magnesium: 2.1 mg/dL (ref 1.7–2.4)

## 2024-08-25 MED ORDER — DILTIAZEM HCL 30 MG PO TABS
60.0000 mg | ORAL_TABLET | Freq: Four times a day (QID) | ORAL | Status: DC
  Administered 2024-08-25 – 2024-08-27 (×9): 60 mg
  Filled 2024-08-25 (×9): qty 2

## 2024-08-25 MED ORDER — ORAL CARE MOUTH RINSE: 15.0000 mL | OROMUCOSAL | Status: DC | PRN

## 2024-08-25 MED ORDER — METHOCARBAMOL 500 MG PO TABS
500.0000 mg | ORAL_TABLET | Freq: Once | ORAL | Status: AC | PRN
  Administered 2024-08-25: 500 mg via ORAL
  Filled 2024-08-25: qty 1

## 2024-08-25 MED ORDER — LOSARTAN POTASSIUM 25 MG PO TABS: 25.0000 mg | ORAL_TABLET | Freq: Every day | ORAL | Status: DC

## 2024-08-25 MED ORDER — METOPROLOL TARTRATE 50 MG PO TABS
100.0000 mg | ORAL_TABLET | Freq: Two times a day (BID) | ORAL | Status: DC
  Administered 2024-08-25 – 2024-08-27 (×4): 100 mg
  Filled 2024-08-25 (×4): qty 2

## 2024-08-25 MED ORDER — METOCLOPRAMIDE HCL 5 MG/ML IJ SOLN
5.0000 mg | Freq: Three times a day (TID) | INTRAMUSCULAR | Status: AC
  Administered 2024-08-25 – 2024-08-26 (×3): 5 mg via INTRAVENOUS
  Filled 2024-08-25 (×3): qty 2

## 2024-08-25 MED ORDER — DEXTROSE-SODIUM CHLORIDE 5-0.9 % IV SOLN: INTRAVENOUS | Status: AC

## 2024-08-25 NOTE — Evaluation (Addendum)
 Physical Therapy Evaluation Patient Details Name: Alexis Mcclain MRN: [id number] DOB: [birth year] Today's Date: 08/25/2024  History of Present Illness  A 74 yr old female patient with severe sharp chest pain radiating to the mid back with SOB. Acute type B thoracic aortic intramural hematoma as well as encephalopathy.  Treating medically without surgery. PMH: emphysema, and tobacco smoking (63 PY) on Albuterol  PRN  Clinical Impression  Pt admitted with above diagnosis. Pt needed +2 assist for all aspects of mobility today due to decr endurance with desaturation on 10L HFNC as well as overall weakness. Pt was independent PTA and didn't use equipment. Pt has supportive family and 24 hour care per family. Will benefit from HHPT.  Pt currently with functional limitations due to the deficits listed below (see PT Problem List). Pt will benefit from acute skilled PT to increase their independence and safety with mobility to allow discharge.           If plan is discharge home, recommend the following: A little help with walking and/or transfers;A little help with bathing/dressing/bathroom;Assistance with feeding;Assist for transportation;Help with stairs or ramp for entrance   Can travel by private vehicle        Equipment Recommendations None recommended by PT  Recommendations for Other Services       Functional Status Assessment Patient has had a recent decline in their functional status and demonstrates the ability to make significant improvements in function in a reasonable and predictable amount of time.     Precautions / Restrictions Precautions Precautions: Fall Restrictions Weight Bearing Restrictions Per Provider Order: No      Mobility  Bed Mobility Overal bed mobility: Needs Assistance Bed Mobility: Supine to Sit     Supine to sit: Min assist     General bed mobility comments: Pt was able to come to EOB with min assist for LEs and trunk wtih incr time.     Transfers Overall transfer level: Needs assistance Equipment used: Rolling walker (2 wheels), 2 person hand held assist Transfers: Sit to/from Stand, Bed to chair/wheelchair/BSC Sit to Stand: Mod assist, +2 safety/equipment, +2 physical assistance, From elevated surface Stand pivot transfers: Mod assist, +2 safety/equipment, +2 physical assistance         General transfer comment: Pt needed mod assist of 2 to power up and then to take pivotal steps to recliner with use of RW. Pt with flexed posture needing assist to move RW and sequence steps.  Pt with poor endurance for activity as well as she reported dizziness upon standing.  BP stable. O2 not registering with transfer with DOE 3/4.  When O2 probe replaced, pt 90% on 10L HFNC.    Ambulation/Gait               General Gait Details: unable today  Stairs            Wheelchair Mobility     Tilt Bed    Modified Rankin (Stroke Patients Only)       Balance Overall balance assessment: Needs assistance Sitting-balance support: No upper extremity supported, Feet supported Sitting balance-Leahy Scale: Fair     Standing balance support: Bilateral upper extremity supported, During functional activity, Reliant on assistive device for balance Standing balance-Leahy Scale: Poor Standing balance comment: relies on external and UE support                             Pertinent Vitals/Pain  Home Living Family/patient expects to be discharged to:: Private residence Living Arrangements: Alone Available Help at Discharge: Family Type of Home: Other(Comment) Home Access: Stairs to enter Entrance Stairs-Rails: Left;Can reach both Secretary/administrator of Steps: 5 (2 to other side)   Home Layout: One level Home Equipment: Building Control Surveyor (4 wheels) Additional Comments: duplex; grandson family    Prior Function Prior Level of Function : Independent/Modified Independent              Mobility Comments: no AD ADLs Comments: pt reports independent; daughter grocery shops     Extremity/Trunk Assessment   Upper Extremity Assessment Upper Extremity Assessment: Right hand dominant    Lower Extremity Assessment Lower Extremity Assessment: Overall WFL for tasks assessed    Cervical / Trunk Assessment Cervical / Trunk Assessment: Kyphotic  Communication   Communication Communication: No apparent difficulties    Cognition   Behavior During Therapy: Flat affect   PT - Cognitive impairments: No apparent impairments                         Following commands: Intact       Cueing Cueing Techniques: Verbal cues, Tactile cues     General Comments General comments (skin integrity, edema, etc.): Desaturate to 87-88% on 10LO2 with activity.  90% on 10LO2 at rest. Soft BP but stable. Other VSS.    Exercises General Exercises - Lower Extremity Ankle Circles/Pumps: AROM, Both, 5 reps, Supine Long Arc Quad: AROM, Both, 10 reps, Seated   Assessment/Plan    PT Assessment Patient needs continued PT services  PT Problem List Decreased balance;Decreased mobility;Decreased activity tolerance;Decreased knowledge of use of DME;Decreased safety awareness;Decreased knowledge of precautions       PT Treatment Interventions DME instruction;Gait training;Functional mobility training;Therapeutic activities;Therapeutic exercise;Balance training;Patient/family education    PT Goals (Current goals can be found in the Care Plan section)  Acute Rehab PT Goals Patient Stated Goal: to go home PT Goal Formulation: With patient Time For Goal Achievement: 09/08/24 Potential to Achieve Goals: Good    Frequency Min 2X/week     Co-evaluation PT/OT/SLP Co-Evaluation/Treatment: Yes Reason for Co-Treatment: Complexity of the patient's impairments (multi-system involvement);For patient/therapist safety PT goals addressed during session: Mobility/safety with mobility          AM-PAC PT 6 Clicks Mobility  Outcome Measure Help needed turning from your back to your side while in a flat bed without using bedrails?: A Little Help needed moving from lying on your back to sitting on the side of a flat bed without using bedrails?: A Lot Help needed moving to and from a bed to a chair (including a wheelchair)?: Total Help needed standing up from a chair using your arms (e.g., wheelchair or bedside chair)?: Total Help needed to walk in hospital room?: Total Help needed climbing 3-5 steps with a railing? : Total 6 Click Score: 9    End of Session Equipment Utilized During Treatment: Gait belt;Oxygen Activity Tolerance: Patient limited by fatigue Patient left: in chair;with call bell/phone within reach;with chair alarm set;with family/visitor present Nurse Communication: Mobility status PT Visit Diagnosis: Unsteadiness on feet (R26.81);Muscle weakness (generalized) (M62.81)    Time: 8948-8877 PT Time Calculation (min) (ACUTE ONLY): 31 min   Charges:   PT Evaluation $PT Eval Moderate Complexity: 1 Mod   PT General Charges $$ ACUTE PT VISIT: 1 Visit         Janari Gagner M,PT Acute Rehab Services [phone]   Stephane JULIANNA Bevel 08/25/2024,  12:50 PM

## 2024-08-25 NOTE — Progress Notes (Signed)
 Inpatient Rehab Admissions Coordinator Note:   Per OT recommendations patient was screened for CIR candidacy by Reche FORBES Lowers, PT. At this time, pt appears to be a potential candidate for CIR. I will place an order for rehab consult for full assessment, per our protocol.  Please contact me any with questions.SABRA Reche Lowers, PT, DPT [phone] 08/25/24 4:22 PM

## 2024-08-25 NOTE — Evaluation (Signed)
 Occupational Therapy Evaluation Patient Details Name: Alexis Mcclain MRN: [id number] DOB: 10-19-1950 Today's Date: 08/25/2024   History of Present Illness   A 74 yr old female patient with severe sharp chest pain radiating to the mid back with SOB. Acute type B thoracic aortic intramural hematoma as well as encephalopathy.  Treating medically without surgery. PMH: emphysema, and tobacco smoking (63 PY) on Albuterol  PRN     Clinical Impressions PTA, pt living alone but her Grandson lives in the other side of her duplex; per family grand daughter likely able to stay with her at discharge. Pt currently limited by supplemental O2 reliance (10L via HFNC), generalized weakness, and decreased cognition. Upon eval, pt needing up to max A for LB ADL and set-up for UB ADL. Able to perform SPT transfers this date ith +2 mod A. Due to pt with excellent support and significant change in functional status, recommending inpatient rehab >3 hours.      If plan is discharge home, recommend the following:   Two people to help with walking and/or transfers;Two people to help with bathing/dressing/bathroom;Assistance with cooking/housework;Assist for transportation;Help with stairs or ramp for entrance     Functional Status Assessment   Patient has had a recent decline in their functional status and demonstrates the ability to make significant improvements in function in a reasonable and predictable amount of time.     Equipment Recommendations   Other (comment) (defer)     Recommendations for Other Services   Rehab consult     Precautions/Restrictions   Precautions Precautions: Fall Restrictions Weight Bearing Restrictions Per Provider Order: No     Mobility Bed Mobility Overal bed mobility: Needs Assistance Bed Mobility: Supine to Sit     Supine to sit: Min assist     General bed mobility comments: Pt was able to come to EOB with min assist for LEs and trunk wtih incr time.     Transfers Overall transfer level: Needs assistance Equipment used: Rolling walker (2 wheels), 2 person hand held assist Transfers: Sit to/from Stand, Bed to chair/wheelchair/BSC Sit to Stand: Mod assist, +2 safety/equipment, +2 physical assistance, From elevated surface Stand pivot transfers: Mod assist, +2 safety/equipment, +2 physical assistance         General transfer comment: Pt needed mod assist of 2 to power up and then to take pivotal steps to recliner with use of RW. Pt with flexed posture needing assist to move RW and sequence steps.  Pt with poor endurance for activity as well as she reported dizziness upon standing.  BP stable. O2 not registering with transfer with DOE 3/4.  When O2 probe replaced, pt 90% on 10L HFNC.      Balance Overall balance assessment: Needs assistance Sitting-balance support: No upper extremity supported, Feet supported Sitting balance-Leahy Scale: Fair     Standing balance support: Bilateral upper extremity supported, During functional activity, Reliant on assistive device for balance Standing balance-Leahy Scale: Poor Standing balance comment: relies on external and UE support                           ADL either performed or assessed with clinical judgement   ADL Overall ADL's : Needs assistance/impaired Eating/Feeding: Set up;Sitting   Grooming: Set up;Sitting;Supervision/safety   Upper Body Bathing: Set up;Supervision/ safety   Lower Body Bathing: Moderate assistance;Sit to/from stand   Upper Body Dressing : Supervision/safety;Sitting   Lower Body Dressing: Moderate assistance;Sit to/from stand   Toilet  Transfer: Moderate assistance;+2 for physical assistance;+2 for safety/equipment   Toileting- Clothing Manipulation and Hygiene: Minimal assistance Toileting - Clothing Manipulation Details (indicate cue type and reason): standing     Functional mobility during ADLs: Minimal assistance;Moderate assistance;+2 for  physical assistance;+2 for safety/equipment       Vision Baseline Vision/History: 0 No visual deficits Ability to See in Adequate Light: 0 Adequate Patient Visual Report: No change from baseline Vision Assessment?: Wears glasses for reading     Perception         Praxis         Pertinent Vitals/Pain Pain Assessment Pain Assessment: No/denies pain     Extremity/Trunk Assessment Upper Extremity Assessment Upper Extremity Assessment: Right hand dominant;Generalized weakness   Lower Extremity Assessment Lower Extremity Assessment: Defer to PT evaluation   Cervical / Trunk Assessment Cervical / Trunk Assessment: Kyphotic   Communication Communication Communication: No apparent difficulties   Cognition Arousal: Alert Behavior During Therapy: Flat affect Cognition: Cognition impaired   Orientation impairments: Time Awareness: Online awareness impaired Memory impairment (select all impairments): Short-term memory Attention impairment (select first level of impairment): Sustained attention, Selective attention Executive functioning impairment (select all impairments): Organization, Sequencing, Problem solving OT - Cognition Comments: following one step commands, needing cues for safety.                 Following commands: Intact       Cueing  General Comments   Cueing Techniques: Verbal cues;Tactile cues  Desaturate to 87-88% on 10LO2 with activity. 90% on 10LO2 at rest. Soft BP but stable. Other VSS.   Exercises Exercises: General Lower Extremity General Exercises - Lower Extremity Ankle Circles/Pumps: AROM, Both, 5 reps, Supine Long Arc Quad: AROM, Both, 10 reps, Seated   Shoulder Instructions      Home Living Family/patient expects to be discharged to:: Private residence Living Arrangements: Alone Available Help at Discharge: Family Type of Home: Other(Comment) Home Access: Stairs to enter [employer] of Steps: 5 (2 to other  side) Entrance Stairs-Rails: Left;Can reach both Home Layout: One level     Bathroom Shower/Tub: Chief Strategy Officer: Standard     Home Equipment: Building Control Surveyor (4 wheels)   Additional Comments: duplex; grandson family      Prior Functioning/Environment Prior Level of Function : Independent/Modified Independent             Mobility Comments: no AD ADLs Comments: pt reports independent; daughter grocery shops    OT Problem List: Decreased strength;Decreased activity tolerance;Impaired balance (sitting and/or standing);Decreased cognition;Decreased safety awareness;Decreased knowledge of use of DME or AE;Cardiopulmonary status limiting activity   OT Treatment/Interventions: Self-care/ADL training;Therapeutic exercise;DME and/or AE instruction;Therapeutic activities;Energy conservation;Patient/family education;Balance training;Cognitive remediation/compensation      OT Goals(Current goals can be found in the care plan section)   Acute Rehab OT Goals Patient Stated Goal: get better OT Goal Formulation: With patient Time For Goal Achievement: 09/08/24 Potential to Achieve Goals: Good   OT Frequency:  Min 2X/week    Co-evaluation PT/OT/SLP Co-Evaluation/Treatment: Yes Reason for Co-Treatment: Complexity of the patient's impairments (multi-system involvement);For patient/therapist safety PT goals addressed during session: Mobility/safety with mobility OT goals addressed during session: ADL's and self-care      AM-PAC OT 6 Clicks Daily Activity     Outcome Measure Help from another person eating meals?: A Little Help from another person taking care of personal grooming?: A Little Help from another person toileting, which includes using toliet, bedpan, or urinal?: A Lot Help  from another person bathing (including washing, rinsing, drying)?: A Lot Help from another person to put on and taking off regular upper body clothing?: A Little Help  from another person to put on and taking off regular lower body clothing?: A Lot 6 Click Score: 15   End of Session Equipment Utilized During Treatment: Gait belt;Other (comment);Oxygen (EVA; 10L HFNC) Nurse Communication: Mobility status  Activity Tolerance: Patient tolerated treatment well Patient left: in chair;with call bell/phone within reach;with chair alarm set;with family/visitor present  OT Visit Diagnosis: Unsteadiness on feet (R26.81);Muscle weakness (generalized) (M62.81);Other symptoms and signs involving cognitive function                Time: 1050-1120 OT Time Calculation (min): 30 min Charges:  OT General Charges $OT Visit: 1 Visit OT Evaluation $OT Eval Moderate Complexity: 1 Mod  Alexis Mcclain, OTR/L [facility] Acute Rehabilitation Office: [phone]   Alexis JONETTA Lebron 08/25/2024, 3:43 PM

## 2024-08-25 NOTE — Plan of Care (Signed)

## 2024-08-25 NOTE — Progress Notes (Signed)
 Controlled  NAME:  KEYLY BALDONADO, MRN:  [id number], DOB:  Jan 04, 1951, LOS: 6 ADMISSION DATE:  08/19/2024, CONSULTATION DATE:  08/19/2024 REFERRING MD:  Theadore, MD, CHIEF COMPLAINT:  chest pain 2:45 am   History of Present Illness:  A 74 yr old female patient with emphysema, and tobacco smoking (63 PY) on Albuterol  PRN, who awoke up at 2:45 am with severe sharp chest pain radiating to the mid back with SOB. She has chronic smoker cough and mild LL edema (stopped lasix  one year ago). No f/c/r, N/V, abd pain, syncope, wheezing, or hemoptysis. No alcohol or illicit drug use. No anti-PLT or AC meds. EMS gave her: 2 nito and 4 baby asa. Initial BP 212/108, then dropped to 134/82.  Pertinent  Medical History  COPD (emphysema), bladder Ca with tumor removal and chemoRx 2019.    Significant Hospital Events: Including procedures, antibiotic start and stop dates in addition to other pertinent events   1/20 admitted. ECHO LVEF 60-65%, RV fxn nml. Gd I diastolic dysfxn 1/21 still on esmolol  gtt. Still having some pain.  1/22 severe CP overnight, stat CT with stable dissection 1/23 agitated and received Haldol , started precedex   1/24 off precedex  1/26 Ileus>SBO via KUB, cortrak to LIS, starting to have evidence of BM  Interim History / Subjective:  NPO   Still having some bilious output via cortrak LIS  Following commands  Objective    Blood pressure (!) 114/44, pulse 61, temperature 97.6 F (36.4 C), temperature source Oral, resp. rate 16, height 5' 4 (1.626 m), weight 75.4 kg, SpO2 97%.        Intake/Output Summary (Last 24 hours) at 08/25/2024 1213 Last data filed at 08/25/2024 0645 Gross per 24 hour  Intake 953.51 ml  Output 1300 ml  Net -346.49 ml   Filed Weights   08/21/24 0500 08/22/24 0500 08/23/24 0500  Weight: 71 kg 71.1 kg 75.4 kg    General: acute on chronic older adult female, sitting up in ICU bed  HEENT: Normocephalic, PERRLA intact, Pink MM, missing teeth  CV: s1,s2,  RRR, no MRG, No JVD  pulm: clear, diminished, no distress on 6 L HFNC  Abs: bs active, soft  Extremities: no edema, no deformity, moves all extremities on command  Skin: no rash  Neuro: Rass 0,follows commands  GU: deferred   Resolved problem list   Assessment and Plan    Acute type B thoracic aortic intramural hematoma P: Continue impulse control goal heart rate less than 60, SBP less than 120 Off esmolol , trending low normotensive-stopping losartan , decreasing Cardizem  dose to 60 mg every 6 hours but will start this evening Continue metoprolol  100 mg twice daily per tube Continue cardiac telemetry Continue to monitor BP while in ICU Will need repeat CTA in 4 to 6 weeks and follow-up with vascular outpatient  Acute encephalopathy due to ICU Delirium P: Continue delirium precautions Continue family reorientation at bedside for patient Continue to try to mobilize up to chair daily at least PT OT  Hyperglycemia-controlled without needing insulin; preDM. A1c 5.8 P: Continue to follow CBGs q4  CBG goal 140-180   Acute on chronic respiratory failure with hypoxia Emphysema w/ on-going tobacco abuse;  1.5ppd smoker  On no home O2 per daughters P: Continue pulm hygiene- HTS nebs, flutter, CPT, PT/OT O2 sat goal >92%  Continue education in regards to tobacco use  Continue brovana  and yulperi  Vomiting SBO- noted per KUB, initially concern for ileus  Constipation P: Continue NGT to suction,  cortrak working for now, would like to utilize larger lumen NGT size  Continue Miralax , Continue Senna  Continue low dose reglan    Leukocytosis Downtrending 18.1> 16.3 P: Continue zosyn  Continue vanc- possible can DC vanc   New 1.4 x 1.0 cm flat ground-glass left lower lobe nodule and 3 mm right upper lobe pulmonary nodule P: Will need f/u surveillance CT scan 6-12 mo  Outpatient f/u   2.4 cm left adrenal nodule, previously 1.7 cm, indeterminate & 1.3cm left renal lesion  P:   F/u outpatient   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

## 2024-08-25 NOTE — Progress Notes (Addendum)
" °  Progress Note    08/25/2024 8:20 AM * No surgery found *  Subjective:  denies any chest pain this morning. Had a little bit of back pain overnight once    Vitals:   08/25/24 0715 08/25/24 0806  BP: (!) 95/58   Pulse: (!) 55   Resp: 14   Temp:  (!) 97.5 F (36.4 C)  SpO2: 97%     Physical Exam: General:  sitting up in bed, lethargic appearing Cardiac:  bradycardic with soft Bps, systolics 90s-100s Lungs:  nonlabored Extremities:  palpable DP pulses bilaterally  CBC    Component Value Date/Time   WBC 16.3 (H) 08/25/2024 0225   RBC 4.28 08/25/2024 0225   HGB 12.0 08/25/2024 0225   HGB 10.8 (L) 01/02/2018 0920   HCT 38.7 08/25/2024 0225   PLT 516 (H) 08/25/2024 0225   PLT 604 (H) 01/02/2018 0920   MCV 90.4 08/25/2024 0225   MCH 28.0 08/25/2024 0225   MCHC 31.0 08/25/2024 0225   RDW 13.4 08/25/2024 0225   LYMPHSABS 2.1 01/02/2018 0920   MONOABS 0.8 01/02/2018 0920   EOSABS 0.1 01/02/2018 0920   BASOSABS 0.0 01/02/2018 0920    BMET    Component Value Date/Time   NA 138 08/25/2024 0505   K 4.1 08/25/2024 0505   CL 97 (L) 08/25/2024 0505   CO2 30 08/25/2024 0505   GLUCOSE 125 (H) 08/25/2024 0505   BUN 33 (H) 08/25/2024 0505   CREATININE 0.97 08/25/2024 0505   CREATININE 0.81 01/02/2018 0920   CALCIUM 9.1 08/25/2024 0505   GFRNONAA >60 08/25/2024 0505   GFRNONAA >60 01/02/2018 0920   GFRAA >60 01/02/2018 0920    INR    Component Value Date/Time   INR 1.0 08/19/2024 0910     Intake/Output Summary (Last 24 hours) at 08/25/2024 0820 Last data filed at 08/25/2024 0645 Gross per 24 hour  Intake 1212.88 ml  Output 1700 ml  Net -487.12 ml      Assessment/Plan:  74 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 She denies any chest pain. Overall she is very lethargic -Remains brady with SBPs <120. Continue to wean esmolol  -BLE remain well perfused with palpable DP pulses -No current indications for  TEVAR   Ahmed Holster, PA-C [facility] [phone] 08/25/2024 8:20 AM   I agree with above. No indication for TEVAR. Continue to wean esmolol .  Norman GORMAN Serve MD [facility] Phone Number: [phone] 08/25/2024 10:05 AM    "

## 2024-08-26 ENCOUNTER — Inpatient Hospital Stay (HOSPITAL_COMMUNITY)

## 2024-08-26 DIAGNOSIS — F17210 Nicotine dependence, cigarettes, uncomplicated: Secondary | ICD-10-CM | POA: Diagnosis not present

## 2024-08-26 DIAGNOSIS — G9340 Encephalopathy, unspecified: Secondary | ICD-10-CM | POA: Diagnosis not present

## 2024-08-26 DIAGNOSIS — J439 Emphysema, unspecified: Secondary | ICD-10-CM | POA: Diagnosis not present

## 2024-08-26 DIAGNOSIS — R911 Solitary pulmonary nodule: Secondary | ICD-10-CM | POA: Diagnosis not present

## 2024-08-26 DIAGNOSIS — R410 Disorientation, unspecified: Secondary | ICD-10-CM | POA: Diagnosis not present

## 2024-08-26 DIAGNOSIS — R5381 Other malaise: Secondary | ICD-10-CM | POA: Diagnosis not present

## 2024-08-26 DIAGNOSIS — I71012 Dissection of descending thoracic aorta: Secondary | ICD-10-CM | POA: Diagnosis not present

## 2024-08-26 DIAGNOSIS — I7100 Dissection of unspecified site of aorta: Secondary | ICD-10-CM | POA: Diagnosis not present

## 2024-08-26 DIAGNOSIS — J9621 Acute and chronic respiratory failure with hypoxia: Secondary | ICD-10-CM | POA: Diagnosis not present

## 2024-08-26 DIAGNOSIS — K567 Ileus, unspecified: Secondary | ICD-10-CM

## 2024-08-26 LAB — CBC
HCT: 37.7 % (ref 36.0–46.0)
Hemoglobin: 11.6 g/dL — ABNORMAL LOW (ref 12.0–15.0)
MCH: 27.8 pg (ref 26.0–34.0)
MCHC: 30.8 g/dL (ref 30.0–36.0)
MCV: 90.2 fL (ref 80.0–100.0)
Platelets: 563 10*3/uL — ABNORMAL HIGH (ref 150–400)
RBC: 4.18 MIL/uL (ref 3.87–5.11)
RDW: 13.5 % (ref 11.5–15.5)
WBC: 11.9 10*3/uL — ABNORMAL HIGH (ref 4.0–10.5)
nRBC: 0 % (ref 0.0–0.2)

## 2024-08-26 LAB — PHOSPHORUS: Phosphorus: 2.4 mg/dL — ABNORMAL LOW (ref 2.5–4.6)

## 2024-08-26 LAB — BASIC METABOLIC PANEL WITH GFR
Anion gap: 9 (ref 5–15)
BUN: 29 mg/dL — ABNORMAL HIGH (ref 8–23)
CO2: 33 mmol/L — ABNORMAL HIGH (ref 22–32)
Calcium: 9.1 mg/dL (ref 8.9–10.3)
Chloride: 96 mmol/L — ABNORMAL LOW (ref 98–111)
Creatinine, Ser: 0.82 mg/dL (ref 0.44–1.00)
GFR, Estimated: >60 mL/min
Glucose, Bld: 97 mg/dL (ref 70–99)
Potassium: 3.6 mmol/L (ref 3.5–5.1)
Sodium: 138 mmol/L (ref 135–145)

## 2024-08-26 LAB — GLUCOSE, CAPILLARY
Glucose-Capillary: 100 mg/dL — ABNORMAL HIGH (ref 70–99)
Glucose-Capillary: 105 mg/dL — ABNORMAL HIGH (ref 70–99)
Glucose-Capillary: 82 mg/dL (ref 70–99)
Glucose-Capillary: 97 mg/dL (ref 70–99)
Glucose-Capillary: 98 mg/dL (ref 70–99)
Glucose-Capillary: 99 mg/dL (ref 70–99)

## 2024-08-26 LAB — MAGNESIUM: Magnesium: 2.1 mg/dL (ref 1.7–2.4)

## 2024-08-26 MED ORDER — METHYLNALTREXONE BROMIDE 12 MG/0.6ML ~~LOC~~ SOLN
12.0000 mg | Freq: Once | SUBCUTANEOUS | Status: AC
  Administered 2024-08-26: 12 mg via SUBCUTANEOUS
  Filled 2024-08-26: qty 0.6

## 2024-08-26 MED ORDER — POTASSIUM CHLORIDE 20 MEQ PO PACK
40.0000 meq | PACK | Freq: Once | ORAL | Status: AC
  Administered 2024-08-26: 40 meq
  Filled 2024-08-26: qty 2

## 2024-08-26 MED ORDER — ACETAMINOPHEN 325 MG PO TABS
650.0000 mg | ORAL_TABLET | Freq: Four times a day (QID) | ORAL | Status: DC
  Administered 2024-08-26: 650 mg via ORAL
  Filled 2024-08-26: qty 2

## 2024-08-26 MED ORDER — VITAL AF 1.2 CAL PO LIQD
1000.0000 mL | ORAL | Status: DC
  Administered 2024-08-26: 1000 mL

## 2024-08-26 MED ORDER — ORAL CARE MOUTH RINSE: 15.0000 mL | OROMUCOSAL | Status: DC | PRN

## 2024-08-26 MED ORDER — OXYCODONE HCL 5 MG PO TABS
5.0000 mg | ORAL_TABLET | ORAL | Status: DC | PRN
  Administered 2024-08-26: 5 mg
  Filled 2024-08-26 (×2): qty 1

## 2024-08-26 MED ORDER — BISACODYL 10 MG RE SUPP
10.0000 mg | Freq: Every day | RECTAL | Status: DC
  Filled 2024-08-26: qty 1

## 2024-08-26 MED ORDER — ORAL CARE MOUTH RINSE
15.0000 mL | OROMUCOSAL | Status: DC
  Administered 2024-08-27 – 2024-09-02 (×23): 15 mL via OROMUCOSAL

## 2024-08-26 MED ORDER — METHOCARBAMOL 500 MG PO TABS
500.0000 mg | ORAL_TABLET | Freq: Once | ORAL | Status: AC
  Administered 2024-08-26: 500 mg via ORAL
  Filled 2024-08-26: qty 1

## 2024-08-26 MED ORDER — QUETIAPINE FUMARATE 25 MG PO TABS
12.5000 mg | ORAL_TABLET | Freq: Every evening | ORAL | Status: DC | PRN
  Administered 2024-08-26: 12.5 mg
  Filled 2024-08-26: qty 1

## 2024-08-26 MED ORDER — ACETAMINOPHEN 325 MG PO TABS
650.0000 mg | ORAL_TABLET | Freq: Four times a day (QID) | ORAL | Status: DC
  Administered 2024-08-26 – 2024-08-27 (×5): 650 mg
  Filled 2024-08-26 (×5): qty 2

## 2024-08-26 MED ORDER — METOCLOPRAMIDE HCL 5 MG/ML IJ SOLN
5.0000 mg | Freq: Three times a day (TID) | INTRAMUSCULAR | Status: AC
  Administered 2024-08-26 – 2024-08-27 (×3): 5 mg via INTRAVENOUS
  Filled 2024-08-26 (×3): qty 2

## 2024-08-26 MED ORDER — LIDOCAINE 5 % EX PTCH
1.0000 | MEDICATED_PATCH | CUTANEOUS | Status: DC
  Administered 2024-08-26 – 2024-09-02 (×8): 1 via TRANSDERMAL
  Filled 2024-08-26 (×9): qty 1

## 2024-08-26 MED ORDER — METHOCARBAMOL 1000 MG/10ML IJ SOLN
1000.0000 mg | Freq: Three times a day (TID) | INTRAMUSCULAR | Status: AC
  Administered 2024-08-26 – 2024-08-28 (×6): 1000 mg via INTRAVENOUS
  Filled 2024-08-26 (×6): qty 10

## 2024-08-26 NOTE — Progress Notes (Signed)
" °  Progress Note    08/26/2024 7:31 AM * No surgery found *  Subjective:  says she felt restless last night. Denies any chest pain    Vitals:   08/26/24 0415 08/26/24 0430  BP:  (!) 120/51  Pulse:  (!) 59  Resp: (!) 29 18  Temp:    SpO2:  96%    Physical Exam: General:  sitting up in bed, NAD Cardiac:  regular rate and rhythm Lungs:  nonlabored, on supplemental O2 Extremities:  palpable DP pulses bilaterally  CBC    Component Value Date/Time   WBC 11.9 (H) 08/26/2024 0429   RBC 4.18 08/26/2024 0429   HGB 11.6 (L) 08/26/2024 0429   HGB 10.8 (L) 01/02/2018 0920   HCT 37.7 08/26/2024 0429   PLT 563 (H) 08/26/2024 0429   PLT 604 (H) 01/02/2018 0920   MCV 90.2 08/26/2024 0429   MCH 27.8 08/26/2024 0429   MCHC 30.8 08/26/2024 0429   RDW 13.5 08/26/2024 0429   LYMPHSABS 2.1 01/02/2018 0920   MONOABS 0.8 01/02/2018 0920   EOSABS 0.1 01/02/2018 0920   BASOSABS 0.0 01/02/2018 0920    BMET    Component Value Date/Time   NA 138 08/26/2024 0429   K 3.6 08/26/2024 0429   CL 96 (L) 08/26/2024 0429   CO2 33 (H) 08/26/2024 0429   GLUCOSE 97 08/26/2024 0429   BUN 29 (H) 08/26/2024 0429   CREATININE 0.82 08/26/2024 0429   CREATININE 0.81 01/02/2018 0920   CALCIUM 9.1 08/26/2024 0429   GFRNONAA >60 08/26/2024 0429   GFRNONAA >60 01/02/2018 0920   GFRAA >60 01/02/2018 0920    INR    Component Value Date/Time   INR 1.0 08/19/2024 0910     Intake/Output Summary (Last 24 hours) at 08/26/2024 0731 Last data filed at 08/26/2024 0400 Gross per 24 hour  Intake 504.65 ml  Output 1775 ml  Net -1270.35 ml      Assessment/Plan:  74 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 She denies any chest pain -BLE remain well perfused with palpable DP pulses -BP remains well controlled off esmolol  drip -No indications for TEVAR   Ahmed Holster, PA-C [facility] [phone] 08/26/2024 7:31 AM     "

## 2024-08-26 NOTE — Telephone Encounter (Signed)
 PT scheduled

## 2024-08-26 NOTE — Progress Notes (Signed)
 Inpatient Rehab Coordinator Note:  I spoke with patient's daughter, Delon, over the phone to discuss CIR recommendations and goals/expectations of CIR stay.  We reviewed 3 hrs/day of therapy, physician follow up, and average length of stay 2 weeks (dependent upon progress) with goals of supervision to mod I.  We reviewed d/c plan either to pt's home with family able to provide rotating 24/7 supervision or to daughter's home with daughter as primary 24/7 supervision.  Daughter confirms insurance and we discussed that I will plan to open prior auth request once she is able to start a diet.    Reche Lowers, PT, DPT Admissions Coordinator [phone] 08/26/24 12:58 PM

## 2024-08-26 NOTE — TOC Progression Note (Signed)
 Transition of Care [facility]) - Progression Note    Patient Details  Name: Alexis Mcclain MRN: [id number] Date of Birth: August 25, 1950  Transition of Care [facility]) CM/SW Contact  Marval Gell, RN Phone Number: 08/26/2024, 10:32 AM  Clinical Narrative:      ICM will continue to follow alongside CIR. Please reach out for any ICM needs.   Expected Discharge Plan: Home w Home Health Services Barriers to Discharge: Continued Medical Work up               Expected Discharge Plan and Services                                               Social Drivers of Health (SDOH) Interventions SDOH Screenings   Food Insecurity: No Food Insecurity (08/19/2024)  Housing: Low Risk (08/19/2024)  Transportation Needs: No Transportation Needs (08/19/2024)  Utilities: Not At Risk (08/19/2024)  Social Connections: Unknown (08/25/2024)  Tobacco Use: High Risk (08/19/2024)    Readmission Risk Interventions     No data to display

## 2024-08-26 NOTE — Progress Notes (Signed)
 Nutrition Follow-up  DOCUMENTATION CODES:   Not applicable  INTERVENTION:   Discussed nutrition poc with Dr. Claudene and ok to resume trickle TF  Tube Feeding via Cortrak:  Vital AF 1.2 at 20 ml/hr (no titration) Goal: Vital AF 1.2 at 55 ml/hr Begin TF at rate of 20 ml/hr, titrate by 10 mL q 8 hours until goal rate of 55 ml/hr TF at goal provides 1584 kcals, 99 g of protein and 1069 mL of free water   NUTRITION DIAGNOSIS:   Inadequate oral intake related to acute illness as evidenced by NPO status.  Being addressed via TF  GOAL:   Patient will meet greater than or equal to 90% of their needs  Progressing  MONITOR:   PO intake, TF tolerance, Labs, Weight trends  REASON FOR ASSESSMENT:   Consult Enteral/tube feeding initiation and management, Assessment of nutrition requirement/status  ASSESSMENT:   74 yo female admitted with acute type B thoracic intramural hematoma-being medically managed at present. 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 RN still using Cortrak for meds and has been flushing.   +multiple large BMs. Noted orders for Reglan    Labs: CBGs 83-98 BUN 29, Creatinine wdl Phosphorus 2.4 (L) Magnesium  2.1 (wdl) Potassium 3.6 (wdl) Sodium 138 (wdl)  Meds: Dulcolax daily Miralax   daily Reglan  Senna Thiamine   Diet Order:   Diet Order             Diet NPO time specified  Diet effective now                   EDUCATION NEEDS:   Education needs have been addressed (family education, pt altered)  Skin:  Skin Assessment: Reviewed RN Assessment  Last BM:  PTA, +flatus, +BS  Height:   Ht Readings from Last 1 Encounters:  08/22/24 5' 4 (1.626 m)    Weight:   Wt Readings from Last 1 Encounters:  08/26/24 69.7 kg     BMI:  Body mass index is 26.38 kg/m.  Estimated Nutritional Needs:   Kcal:  1600-1800 kcals  Protein:  75-95 g  Fluid:  >/= 1.6 L   Betsey Finger MS, RDN, LDN, CNSC Registered Dietitian 3 Clinical Nutrition RD Inpatient Contact Info in Amion

## 2024-08-26 NOTE — PMR Pre-admission (Shared)
 PMR Admission Coordinator Pre-Admission Assessment  Patient: Alexis Mcclain is an 74 y.o., female MRN: [id number] DOB: [birth year] Height: 5' 4 (162.6 cm) Weight: 69.7 kg  Insurance Information HMO: ***    PPO: ***     PCP: ***     IPA: ***     80/20: ***     OTHER: *** PRIMARY: ***      Policy#: ***      Subscriber: *** CM Name: ***      Phone#: ***     Fax#: *** Pre-Cert#: ***      Employer: *** Benefits:  Phone #: ***     Name: *** Eff. Date: ***     Deduct: ***      Out of Pocket Max: ***      Life Max: *** CIR: ***      SNF: *** Outpatient: ***     Co-Pay: *** Home Health: ***      Co-Pay: *** DME: ***     Co-Pay: *** Providers: *** SECONDARY: ***      Policy#: ***     Phone#: ***  Financial Counselor: ***      Phone#: ***  The Data Collection Information Summary for patients in Inpatient Rehabilitation Facilities with attached Privacy Act Statement-Health Care Records was provided and verbally reviewed with: {CHL IP Patient Family WJ:695449998}  Emergency Contact Information Contact Information     Name Relation Home Work Mobile   CHAMBERS,JENNIFER Daughter   [phone]      Other Contacts   None on File     Current Medical History  Patient Admitting Diagnosis: *** History of Present Illness: ***    Patient's medical record from *** has been reviewed by the rehabilitation admission coordinator and physician.  Past Medical History  Past Medical History:  Diagnosis Date   Anxiety    Cancer (HCC)    bladder    Dizzy    occ last week or so   Dyspnea    with exertion   Hematuria last 3 months   History of blood transfusion 1980   after surgery for ovarian cyst rupture lost pregnancy   UTI (urinary tract infection)    on ciprofloxacin    Has the patient had major surgery during 100 days prior to admission? {Yes/No/Unknown:304600602}  Family History   family history is not on file.  Current Medications Current Medications[1]  Patients Current  Diet:  Diet Order             Diet NPO time specified  Diet effective now                   Precautions / Restrictions Precautions Precautions: Fall Restrictions Weight Bearing Restrictions Per Provider Order: No   Has the patient had 2 or more falls or a fall with injury in the past year? {Yes/No/Unknown:304600602}  Prior Activity Level Limited Community (1-2x/wk): no DME prior to admit, not driving (dtr drives), completed ADLs independently  Prior Functional Level Self Care: Did the patient need help bathing, dressing, using the toilet or eating? {Prior Functional Ozczo:695399399}  Indoor Mobility: Did the patient need assistance with walking from room to room (with or without device)? {Prior Functional Ozczo:695399399}  Stairs: Did the patient need assistance with internal or external stairs (with or without device)? {Prior Functional Ozczo:695399399}  Functional Cognition: Did the patient need help planning regular tasks such as shopping or remembering to take medications? {Prior Functional Ozczo:695399399}  Patient Information Are you of Hispanic,  Latino/a,or Spanish origin?: A. No, not of Hispanic, Latino/a, or Spanish origin What is your race?: A. White Do you need or want an interpreter to communicate with a doctor or health care staff?: 0. No  Patient's Response To:  Health Literacy and Transportation Is the patient able to respond to health literacy and transportation needs?: Yes Health Literacy - How often do you need to have someone help you when you read instructions, pamphlets, or other written material from your doctor or pharmacy?: Never In the past 12 months, has lack of transportation kept you from medical appointments or from getting medications?: No In the past 12 months, has lack of transportation kept you from meetings, work, or from getting things needed for daily living?: No  Journalist, Newspaper / Equipment Home Equipment: It sales professional,  Rollator (4 wheels)  Prior Device Use: Indicate devices/aids used by the patient prior to current illness, exacerbation or injury? {Prior Device Ldzi:695399398}  Current Functional Level Cognition  Orientation Level: Oriented X4    Extremity Assessment (includes Sensation/Coordination)  Upper Extremity Assessment: Right hand dominant, Generalized weakness  Lower Extremity Assessment: Defer to PT evaluation    ADLs  Overall ADL's : Needs assistance/impaired Eating/Feeding: Set up, Sitting Grooming: Set up, Sitting, Supervision/safety Upper Body Bathing: Set up, Supervision/ safety Lower Body Bathing: Moderate assistance, Sit to/from stand Upper Body Dressing : Supervision/safety, Sitting Lower Body Dressing: Moderate assistance, Sit to/from stand Toilet Transfer: Moderate assistance, +2 for physical assistance, +2 for safety/equipment Toileting- Clothing Manipulation and Hygiene: Minimal assistance Toileting - Clothing Manipulation Details (indicate cue type and reason): standing Functional mobility during ADLs: Minimal assistance, Moderate assistance, +2 for physical assistance, +2 for safety/equipment    Mobility  Overal bed mobility: Needs Assistance Bed Mobility: Supine to Sit Supine to sit: Min assist General bed mobility comments: Pt was able to come to EOB with min assist for LEs and trunk wtih incr time.    Transfers  Overall transfer level: Needs assistance Equipment used: Rolling walker (2 wheels), 2 person hand held assist Transfers: Sit to/from Stand, Bed to chair/wheelchair/BSC Sit to Stand: Mod assist, +2 safety/equipment, +2 physical assistance, From elevated surface Bed to/from chair/wheelchair/BSC transfer type:: Stand pivot Stand pivot transfers: Mod assist, +2 safety/equipment, +2 physical assistance General transfer comment: Pt needed mod assist of 2 to power up and then to take pivotal steps to recliner with use of RW. Pt with flexed posture needing assist  to move RW and sequence steps.  Pt with poor endurance for activity as well as she reported dizziness upon standing.  BP stable. O2 not registering with transfer with DOE 3/4.  When O2 probe replaced, pt 90% on 10L HFNC.    Ambulation / Gait / Stairs / Wheelchair Mobility  Ambulation/Gait General Gait Details: unable today    Posture / Balance Balance Overall balance assessment: Needs assistance Sitting-balance support: No upper extremity supported, Feet supported Sitting balance-Leahy Scale: Fair Standing balance support: Bilateral upper extremity supported, During functional activity, Reliant on assistive device for balance Standing balance-Leahy Scale: Poor Standing balance comment: relies on external and UE support    Special considerations/life events  {Special Care Needs/Care Considerations:304600603}   Previous Home Environment (from acute therapy documentation) Living Arrangements: Alone Available Help at Discharge: Family Type of Home: Other(Comment) Home Layout: One level Home Access: Stairs to enter Entrance Stairs-Rails: Left, Can reach both Entrance Stairs-Number of Steps: 5 (2 to other side) Bathroom Shower/Tub: Engineer, Manufacturing Systems: Standard Home Care Services: No Additional Comments:  duplex; grandson family  Discharge Living Setting Plans for Discharge Living Setting: Patient's home Type of Home at Discharge: House (duplex, grandson and his family lives in other side) Discharge Home Layout: One level Discharge Home Access: Stairs to enter Entrance Stairs-Rails: Can reach both Entrance Stairs-Number of Steps: 5 Discharge Bathroom Shower/Tub: Tub/shower unit Discharge Bathroom Toilet: Standard Discharge Bathroom Accessibility: Yes How Accessible: Accessible via walker Does the patient have any problems obtaining your medications?: No  Social/Family/Support Systems Anticipated Caregiver: daughter, Delon is primary contact Anticipated Caregiver's  Contact Information: [phone] Ability/Limitations of Caregiver: works from home for [employer] Caregiver Availability: 24/7 Discharge Plan Discussed with Primary Caregiver: Yes Is Caregiver In Agreement with Plan?: Yes Does Caregiver/Family have Issues with Lodging/Transportation while Pt is in Rehab?: No  Goals Patient/Family Goal for Rehab: PT/OT supervision, SLP mod I Expected length of stay: 8-12 days Additional Information: Discharge plan: first choice to pt's previous living environment with family providing 24/7 supervision, can also d/c to daughter's home if needed Pt/Family Agrees to Admission and willing to participate: Yes Program Orientation Provided & Reviewed with Pt/Caregiver Including Roles  & Responsibilities: Yes  Decrease burden of Care through IP rehab admission: {Inpatient Rehab Care:20780}  Possible need for SNF placement upon discharge: ***  Patient Condition: {PATIENT'S CONDITION:22832}  Preadmission Screen Completed By:  Reche FORBES Lowers, 08/26/2024 1:00 PM ______________________________________________________________________   Discussed status with Dr. PIERRETTE on *** at *** and received approval for admission today.  Admission Coordinator:  Gianna Calef E Infantof Villagomez, PT, time PIERRETTEPattricia ***   Assessment/Plan: Diagnosis: *** Does the need for close, 24 hr/day Medical supervision in concert with the patient's rehab needs make it unreasonable for this patient to be served in a less intensive setting? {yes_no_potentially:3041433} Co-Morbidities requiring supervision/potential complications: *** Due to {due un:6958565}, does the patient require 24 hr/day rehab nursing? {yes_no_potentially:3041433} Does the patient require coordinated care of a physician, rehab nurse, PT, OT, and SLP to address physical and functional deficits in the context of the above medical diagnosis(es)? {yes_no_potentially:3041433} Addressing deficits in the following areas: {deficits:3041436} Can the  patient actively participate in an intensive therapy program of at least 3 hrs of therapy 5 days a week? {yes_no_potentially:3041433} The potential for patient to make measurable gains while on inpatient rehab is {potential:3041437} Anticipated functional outcomes upon discharge from inpatient rehab: {functional outcomes:304600100} PT, {functional outcomes:304600100} OT, {functional outcomes:304600100} SLP Estimated rehab length of stay to reach the above functional goals is: *** Anticipated discharge destination: {anticipated dc setting:21604} 10. Overall Rehab/Functional Prognosis: {potential:3041437}   MD Signature: ***    [1]  Current Facility-Administered Medications:    acetaminophen  (TYLENOL ) tablet 650 mg, 650 mg, Per Tube, QID, Paytes, Emma U, RPH   arformoterol  (BROVANA ) nebulizer solution 15 mcg, 15 mcg, Nebulization, BID, Gretta Leita SQUIBB, DO, 15 mcg at 08/25/24 1935   bisacodyl  (DULCOLAX) suppository 10 mg, 10 mg, Rectal, Q0600, Claudene Toribio BROCKS, MD   Chlorhexidine  Gluconate Cloth 2 % PADS 6 each, 6 each, Topical, Daily, Albustami, Omar M, MD, 6 each at 08/26/24 0945   clevidipine  (CLEVIPREX ) infusion 0.5 mg/mL, 0-21 mg/hr, Intravenous, Continuous, Gretta Leita SQUIBB, DO, Stopped at 08/23/24 1444   dextrose  5 %-0.9 % sodium chloride  infusion, , Intravenous, Continuous, Gleason, Leita SAUNDERS, PA-C, Last Rate: 50 mL/hr at 08/26/24 1200, Infusion Verify at 08/26/24 1200   diltiazem  (CARDIZEM ) tablet 60 mg, 60 mg, Per Tube, Q6H, Gretta Leita SQUIBB, DO, 60 mg at 08/26/24 1117   esmolol  (BREVIBLOC ) 2000 mg / 100 mL (20 mg/mL) infusion, 25-300  mcg/kg/min, Intravenous, Continuous, Jenna Maude BRAVO, NP, Stopped at 08/25/24 0241   HYDROmorphone  (DILAUDID ) injection 0.5-1 mg, 0.5-1 mg, Intravenous, Q3H PRN, Claudene Fonda BROCKS, NP, 1 mg at 08/23/24 1740   ipratropium-albuterol  (DUONEB) 0.5-2.5 (3) MG/3ML nebulizer solution 3 mL, 3 mL, Nebulization, Q6H PRN, Albustami, Omar M, MD, 3 mL at 08/23/24 1851    labetalol  (NORMODYNE ) injection 20 mg, 20 mg, Intravenous, Q10 min PRN, Adolph, Lauren E, PA-C, 20 mg at 08/22/24 1438   lidocaine  (LIDODERM ) 5 % 1 patch, 1 patch, Transdermal, Q24H, Claudene Toribio BROCKS, MD, 1 patch at 08/26/24 1118   methocarbamol  (ROBAXIN ) injection 1,000 mg, 1,000 mg, Intravenous, Q8H, Claudene Toribio BROCKS, MD   metoCLOPramide  (REGLAN ) injection 5 mg, 5 mg, Intravenous, Q8H, Claudene Toribio BROCKS, MD   metoprolol  tartrate (LOPRESSOR ) tablet 100 mg, 100 mg, Per Tube, BID, Gretta Leita SQUIBB, DO, 100 mg at 08/26/24 9057   nicotine  (NICODERM CQ  - dosed in mg/24 hours) patch 21 mg, 21 mg, Transdermal, Daily, Stretch, Lamar PARAS, MD, 21 mg at 08/26/24 0944   ondansetron  (ZOFRAN ) injection 4 mg, 4 mg, Intravenous, Q6H PRN, Albustami, Omar M, MD, 4 mg at 08/24/24 1142   Oral care mouth rinse, 15 mL, Mouth Rinse, PRN, Gretta Leita P, DO   oxyCODONE  (Oxy IR/ROXICODONE ) immediate release tablet 5 mg, 5 mg, Per Tube, Q4H PRN, Paytes, Emma U, RPH   piperacillin -tazobactam (ZOSYN ) IVPB 3.375 g, 3.375 g, Intravenous, Q8H, Cyndy Ozell DASEN, RPH, Stopped at 08/26/24 1032   polyethylene glycol (MIRALAX  / GLYCOLAX ) packet 17 g, 17 g, Oral, Daily PRN, Albustami, Omar M, MD   polyethylene glycol (MIRALAX  / GLYCOLAX ) packet 17 g, 17 g, Per Tube, Daily, Denninger, Jade M, RPH, 17 g at 08/26/24 9055   QUEtiapine  (SEROQUEL ) tablet 12.5 mg, 12.5 mg, Per Tube, QHS PRN, Paytes, Emma U, RPH   revefenacin  (YUPELRI ) nebulizer solution 175 mcg, 175 mcg, Nebulization, Daily, Gretta Leita SQUIBB, DO, 175 mcg at 08/24/24 9185   senna (SENOKOT) tablet 8.6 mg, 1 tablet, Oral, BID PRN, Albustami, Mancel HERO, MD   sodium chloride  flush (NS) 0.9 % injection 10-40 mL, 10-40 mL, Intracatheter, Q12H, Albustami, Mancel HERO, MD, 10 mL at 08/26/24 0945   sodium chloride  flush (NS) 0.9 % injection 10-40 mL, 10-40 mL, Intracatheter, PRN, Albustami, Mancel HERO, MD   sodium chloride  flush (NS) 0.9 % injection 10-40 mL, 10-40 mL, Intracatheter, Q12H, Sheree Penne Bruckner, MD, 3 mL at 08/26/24 0945   sodium chloride  flush (NS) 0.9 % injection 10-40 mL, 10-40 mL, Intracatheter, PRN, Sheree Penne Bruckner, MD   thiamine  (VITAMIN B1) tablet 100 mg, 100 mg, Per Tube, Daily, Gretta Leita P, DO, 100 mg at 08/26/24 9057   vancomycin  (VANCOREADY) IVPB 1250 mg/250 mL, 1,250 mg, Intravenous, Q24H, Cyndy Ozell DASEN, [facility], Stopped at 08/25/24 2326

## 2024-08-26 NOTE — Progress Notes (Signed)
 Controlled  NAME:  Alexis Mcclain, MRN:  [id number], DOB:  February 09, 1951, LOS: 7 ADMISSION DATE:  08/19/2024, CONSULTATION DATE:  08/19/2024 REFERRING MD:  Theadore, MD, CHIEF COMPLAINT:  chest pain 2:45 am   History of Present Illness:  A 74 yr old female patient with emphysema, and tobacco smoking (63 PY) on Albuterol  PRN, who awoke up at 2:45 am with severe sharp chest pain radiating to the mid back with SOB. She has chronic smoker cough and mild LL edema (stopped lasix  one year ago). No f/c/r, N/V, abd pain, syncope, wheezing, or hemoptysis. No alcohol or illicit drug use. No anti-PLT or AC meds. EMS gave her: 2 nito and 4 baby asa. Initial BP 212/108, then dropped to 134/82.  Pertinent  Medical History  COPD (emphysema), bladder Ca with tumor removal and chemoRx 2019.    Significant Hospital Events: Including procedures, antibiotic start and stop dates in addition to other pertinent events   1/20 admitted. ECHO LVEF 60-65%, RV fxn nml. Gd I diastolic dysfxn 1/21 still on esmolol  gtt. Still having some pain.  1/22 severe CP overnight, stat CT with stable dissection 1/23 agitated and received Haldol , started precedex   1/24 off precedex  1/26 Ileus>SBO via KUB, cortrak to LIS, starting to have evidence of BM  Interim History / Subjective:  Pain continues to be intermittent issue. O2 needs also remain high but she has not been mobilizing much Cortrak remains on suction  Objective    Blood pressure (!) 112/48, pulse 60, temperature 99 F (37.2 C), temperature source Oral, resp. rate (!) 22, height 5' 4 (1.626 m), weight 69.7 kg, SpO2 (!) 88%.        Intake/Output Summary (Last 24 hours) at 08/26/2024 1041 Last data filed at 08/26/2024 1000 Gross per 24 hour  Intake 718.24 ml  Output 1825 ml  Net -1106.76 ml   Filed Weights   08/23/24 0500 08/25/24 0709 08/26/24 0500  Weight: 75.4 kg 72.5 kg 69.7 kg    No distress Abd feels very soft Lungs clear Ext warm Good pulses x  4 Globally weak  Labs are fine except some mild thrombocytosis  Resolved problem list   Assessment and Plan    Acute type B thoracic aortic intramural hematoma Acute encephalopathy due to ICU Delirium Borderline hypoglycemia Ileus- seems improved, starting to move bowels Pulmonary nodules- CT scan around June 2026 Acute on chronic respiratory failure with hypoxia Emphysema w/ on-going tobacco abuse;  1.5ppd smoker  On no home O2 per daughters- atelectasis and splinting seems to be bigger issue here  Clamp cortrak, use for meds Relistor  x 1 Per tube BP meds Push mobility, up to chair Limit opiates as able: start standing robaxin , tylenol , lidocaine  patches Reorient PRN, encourage day/night cycles and family visitation, minimize nightly disruptions Add trial of at bedtime seroquel  low dose LABA/LAMA ICS Keep in ICU one more day for airway watch given relatively high O2 needs Check CXR Potentially start diet tomorrow  Rolan Sharps MD PCCM

## 2024-08-26 NOTE — Plan of Care (Signed)
   Problem: Education: Goal: Knowledge of General Education information will improve Description Including pain rating scale, medication(s)/side effects and non-pharmacologic comfort measures Outcome: Progressing

## 2024-08-26 NOTE — Consult Note (Signed)
 "     Physical Medicine and Rehabilitation Consult Reason for Consult: Impaired functional mobility and confusion Referring Physician: Claudene   HPI: Alexis Mcclain is a 74 y.o. female with history of COPD, tobacco use who presented on 08/19/2024 with a type B aortic dissection, hypertensive emergency as well as acute on chronic respiratory failure, and lethargy.  Patient developed vomiting with concern for ileus.  NG tube was placed to suction.  Empiric antibiotics initiated for leukocytosis.  Vascular surgery consulted for dissection and conservative management was recommended with repeat CTA in 4 to 6 weeks as an outpatient.  Patient lives alone in a 1 level house with 2-5 steps to enter.  She has family who can assist.  Most recently she has been mod assist for sit to stand transfers and mod assist for ADLs.    Home: Home Living Family/patient expects to be discharged to:: Private residence Living Arrangements: Alone Available Help at Discharge: Family Type of Home: Other(Comment) Home Access: Stairs to enter [employer] of Steps: 5 (2 to other side) Entrance Stairs-Rails: Left, Can reach both Home Layout: One level Bathroom Shower/Tub: Engineer, Manufacturing Systems: Standard Home Equipment: It sales professional, Rollator (4 wheels) Additional Comments: duplex; grandson family  Functional History: Prior Function Prior Level of Function : Independent/Modified Independent Mobility Comments: no AD ADLs Comments: pt reports independent; daughter grocery shops Functional Status:  Mobility: Bed Mobility Overal bed mobility: Needs Assistance Bed Mobility: Supine to Sit Supine to sit: Min assist General bed mobility comments: Pt was able to come to EOB with min assist for LEs and trunk wtih incr time. Transfers Overall transfer level: Needs assistance Equipment used: Rolling walker (2 wheels), 2 person hand held assist Transfers: Sit to/from Stand, Bed to  chair/wheelchair/BSC Sit to Stand: Mod assist, +2 safety/equipment, +2 physical assistance, From elevated surface Bed to/from chair/wheelchair/BSC transfer type:: Stand pivot Stand pivot transfers: Mod assist, +2 safety/equipment, +2 physical assistance General transfer comment: Pt needed mod assist of 2 to power up and then to take pivotal steps to recliner with use of RW. Pt with flexed posture needing assist to move RW and sequence steps.  Pt with poor endurance for activity as well as she reported dizziness upon standing.  BP stable. O2 not registering with transfer with DOE 3/4.  When O2 probe replaced, pt 90% on 10L HFNC. Ambulation/Gait General Gait Details: unable today    ADL: ADL Overall ADL's : Needs assistance/impaired Eating/Feeding: Set up, Sitting Grooming: Set up, Sitting, Supervision/safety Upper Body Bathing: Set up, Supervision/ safety Lower Body Bathing: Moderate assistance, Sit to/from stand Upper Body Dressing : Supervision/safety, Sitting Lower Body Dressing: Moderate assistance, Sit to/from stand Toilet Transfer: Moderate assistance, +2 for physical assistance, +2 for safety/equipment Toileting- Clothing Manipulation and Hygiene: Minimal assistance Toileting - Clothing Manipulation Details (indicate cue type and reason): standing Functional mobility during ADLs: Minimal assistance, Moderate assistance, +2 for physical assistance, +2 for safety/equipment  Cognition: Cognition Orientation Level: Oriented X4 Cognition Arousal: Alert Behavior During Therapy: Flat affect   Review of Systems  Constitutional:  Positive for malaise/fatigue.  HENT: Negative.    Eyes: Negative.   Respiratory:  Positive for cough and shortness of breath.   Cardiovascular:  Positive for chest pain.  Genitourinary: Negative.   Musculoskeletal: Negative.   Skin: Negative.   Neurological:  Positive for weakness.  Psychiatric/Behavioral:  The patient has insomnia.    Past Medical  History:  Diagnosis Date   Anxiety    Cancer (HCC)  bladder    Dizzy    occ last week or so   Dyspnea    with exertion   Hematuria last 3 months   History of blood transfusion 1980   after surgery for ovarian cyst rupture lost pregnancy   UTI (urinary tract infection)    on ciprofloxacin   Past Surgical History:  Procedure Laterality Date   ABDOMINAL HYSTERECTOMY  1990's   partial 1 ovary left   colonscopy  yrs ago   IR FLUORO GUIDE PORT INSERTION RIGHT  12/10/2017   IR US  GUIDE VASC ACCESS RIGHT  12/10/2017   surgery for ovarian cyst   1990's   TRANSURETHRAL RESECTION OF BLADDER TUMOR N/A 10/26/2017   Procedure: TRANSURETHRAL RESECTION OF BLADDER TUMOR / CYSTOSCOPY(TURBT);  Surgeon: Devere Lonni Righter, MD;  Location: [facility];  Service: Urology;  Laterality: N/A;  ONLY NEEDS 90 MIN   TUBAL LIGATION  1981   laparoscopic   History reviewed. No pertinent family history. Social History:  reports that she has been smoking cigarettes. She has a 63 pack-year smoking history. She has never used smokeless tobacco. She reports that she does not drink alcohol and does not use drugs. Allergies: Allergies[1] Medications Prior to Admission  Medication Sig Dispense Refill   albuterol  (VENTOLIN  HFA) 108 (90 Base) MCG/ACT inhaler Inhale 2 puffs into the lungs every 6 (six) hours as needed for wheezing.     ibuprofen (ADVIL) 200 MG tablet Take 200-400 mg by mouth daily as needed for moderate pain (pain score 4-6).     Moringa Oleifera (MORINGA PO) Take 1 capsule by mouth daily.       Blood pressure (!) 99/54, pulse 63, temperature 99 F (37.2 C), temperature source Oral, resp. rate 18, height 5' 4 (1.626 m), weight 69.7 kg, SpO2 96%. Physical Exam Constitutional:      Appearance: She is ill-appearing.  HENT:     Head: Normocephalic.     Nose:     Comments: NGT in place Eyes:     Extraocular Movements: Extraocular movements intact.  Cardiovascular:     Rate  and Rhythm: Normal rate.  Pulmonary:     Effort: Pulmonary effort is normal.     Comments: O2 [place] Abdominal:     Tenderness: There is no abdominal tenderness.  Musculoskeletal:        General: No swelling or tenderness.     Cervical back: Normal range of motion.  Skin:    General: Skin is warm.  Neurological:     Comments: Slow to arouse, fatigued. Alert and oriented x 3. Reasonable insight and awareness. Intact Memory. Normal language and speech. Cranial nerve exam unremarkable. MMT: BUE 4/5 prox to distal. BLE 3- HF, KE and 4/5 ADF/PF. Sensory exam normal for light touch and pain in all 4 limbs. No limb ataxia or cerebellar signs. No abnormal tone appreciated.  SABRA    Psychiatric:     Comments: Pt flat but cooperative     Results for orders placed or performed during the hospital encounter of 08/19/24 (from the past 24 hours)  Glucose, capillary     Status: Abnormal   Collection Time: 08/25/24 11:43 AM  Result Value Ref Range   Glucose-Capillary 110 (H) 70 - 99 mg/dL  Glucose, capillary     Status: None   Collection Time: 08/25/24  4:28 PM  Result Value Ref Range   Glucose-Capillary 99 70 - 99 mg/dL  Glucose, capillary     Status: None  Collection Time: 08/25/24  8:22 PM  Result Value Ref Range   Glucose-Capillary 93 70 - 99 mg/dL  Glucose, capillary     Status: None   Collection Time: 08/25/24 11:42 PM  Result Value Ref Range   Glucose-Capillary 75 70 - 99 mg/dL  Glucose, capillary     Status: Abnormal   Collection Time: 08/26/24  3:43 AM  Result Value Ref Range   Glucose-Capillary 100 (H) 70 - 99 mg/dL  Magnesium      Status: None   Collection Time: 08/26/24  4:29 AM  Result Value Ref Range   Magnesium  2.1 1.7 - 2.4 mg/dL  Phosphorus     Status: Abnormal   Collection Time: 08/26/24  4:29 AM  Result Value Ref Range   Phosphorus 2.4 (L) 2.5 - 4.6 mg/dL  CBC     Status: Abnormal   Collection Time: 08/26/24  4:29 AM  Result Value Ref Range   WBC 11.9 (H) 4.0 - 10.5 K/uL    RBC 4.18 3.87 - 5.11 MIL/uL   Hemoglobin 11.6 (L) 12.0 - 15.0 g/dL   HCT 62.2 63.9 - 53.9 %   MCV 90.2 80.0 - 100.0 fL   MCH 27.8 26.0 - 34.0 pg   MCHC 30.8 30.0 - 36.0 g/dL   RDW 86.4 88.4 - 84.4 %   Platelets 563 (H) 150 - 400 K/uL   nRBC 0.0 0.0 - 0.2 %  Basic metabolic panel with GFR     Status: Abnormal   Collection Time: 08/26/24  4:29 AM  Result Value Ref Range   Sodium 138 135 - 145 mmol/L   Potassium 3.6 3.5 - 5.1 mmol/L   Chloride 96 (L) 98 - 111 mmol/L   CO2 33 (H) 22 - 32 mmol/L   Glucose, Bld 97 70 - 99 mg/dL   BUN 29 (H) 8 - 23 mg/dL   Creatinine, Ser 9.17 0.44 - 1.00 mg/dL   Calcium 9.1 8.9 - 89.6 mg/dL   GFR, Estimated >39 >39 mL/min   Anion gap 9 5 - 15  Glucose, capillary     Status: None   Collection Time: 08/26/24  8:23 AM  Result Value Ref Range   Glucose-Capillary 82 70 - 99 mg/dL   DG Abd Portable 1V Result Date: 08/24/2024 EXAM: 1 VIEW XRAY OF THE ABDOMEN 08/24/2024 12:33:00 PM COMPARISON: CT 08/21/2024. CLINICAL HISTORY: Nausea and vomiting. Encounter for nasogastric tube placement. ICD10: 822942 Nausea and vomiting; [id number] Encounter for nasogastric tube placement. FINDINGS: LINES, TUBES AND DEVICES: Enteric tube in place with tip overlying the expected region of the pylorus. Right chest port catheter in place. BOWEL: Multiple loops of small bowel dilated with gas up to 3 cm. SOFT TISSUES: Surgical clips in pelvis. No abnormal calcifications. BONES: No acute fracture. IMPRESSION: 1. Enteric tube tip overlies the expected region of the pylorus. 2. Multiple loops of small bowel dilated with gas up to 3 cm, consistent with small bowel obstruction. Electronically signed by: Dayne Hassell MD 08/24/2024 12:54 PM EST RP Workstation: [id number]    Assessment/Plan: Diagnosis: 74 year old female with debility and encephalopathy related to aortic dissection and acute on chronic respiratory failure Does the need for close, 24 hr/day medical supervision in concert with  the patient's rehab needs make it unreasonable for this patient to be served in a less intensive setting? Yes Co-Morbidities requiring supervision/potential complications:  - Ileus -Hypertension -Leukocytosis -swallowing/nutrition Due to bladder management, bowel management, safety, skin/wound care, disease management, medication administration, pain management, and patient education, does  the patient require 24 hr/day rehab nursing? Yes Does the patient require coordinated care of a physician, rehab nurse, therapy disciplines of PT, OT, SLP to address physical and functional deficits in the context of the above medical diagnosis(es)? Yes Addressing deficits in the following areas: balance, endurance, locomotion, strength, transferring, bowel/bladder control, bathing, dressing, feeding, grooming, toileting, cognition, swallowing, and psychosocial support Can the patient actively participate in an intensive therapy program of at least 3 hrs of therapy per day at least 5 days per week? Yes The potential for patient to make measurable gains while on inpatient rehab is excellent Anticipated functional outcomes upon discharge from inpatient rehab are supervision  with PT, supervision with OT, modified independent with SLP. Estimated rehab length of stay to reach the above functional goals is: 8-12 days Anticipated discharge destination: Home Overall Rehab/Functional Prognosis: excellent  POST ACUTE RECOMMENDATIONS: This patient's condition is appropriate for continued rehabilitative care in the following setting: CIR Patient has agreed to participate in recommended program. Yes Note that insurance prior authorization may be required for reimbursement for recommended care.  Comment: Rehab Admissions Coordinator to follow up.      I have personally performed a face to face diagnostic evaluation of this patient. Additionally, I have examined the patient's medical record including any pertinent labs  and radiographic images.    Thanks,  Arthea ONEIDA Gunther, MD 08/26/2024     [1]  Allergies Allergen Reactions   Nickel     Whelps   Tape Other (See Comments)    Burns skin    Hurt when pulled off   "

## 2024-08-27 ENCOUNTER — Inpatient Hospital Stay (HOSPITAL_COMMUNITY)

## 2024-08-27 DIAGNOSIS — J9621 Acute and chronic respiratory failure with hypoxia: Secondary | ICD-10-CM | POA: Diagnosis not present

## 2024-08-27 DIAGNOSIS — J811 Chronic pulmonary edema: Secondary | ICD-10-CM

## 2024-08-27 DIAGNOSIS — J439 Emphysema, unspecified: Secondary | ICD-10-CM | POA: Diagnosis not present

## 2024-08-27 DIAGNOSIS — R410 Disorientation, unspecified: Secondary | ICD-10-CM | POA: Diagnosis not present

## 2024-08-27 DIAGNOSIS — I71012 Dissection of descending thoracic aorta: Secondary | ICD-10-CM | POA: Diagnosis not present

## 2024-08-27 LAB — BASIC METABOLIC PANEL WITH GFR
Anion gap: 10 (ref 5–15)
BUN: 22 mg/dL (ref 8–23)
CO2: 31 mmol/L (ref 22–32)
Calcium: 9.3 mg/dL (ref 8.9–10.3)
Chloride: 98 mmol/L (ref 98–111)
Creatinine, Ser: 0.76 mg/dL (ref 0.44–1.00)
GFR, Estimated: >60 mL/min
Glucose, Bld: 101 mg/dL — ABNORMAL HIGH (ref 70–99)
Potassium: 3.6 mmol/L (ref 3.5–5.1)
Sodium: 138 mmol/L (ref 135–145)

## 2024-08-27 LAB — CBC
HCT: 39.3 % (ref 36.0–46.0)
Hemoglobin: 12.2 g/dL (ref 12.0–15.0)
MCH: 28 pg (ref 26.0–34.0)
MCHC: 31 g/dL (ref 30.0–36.0)
MCV: 90.3 fL (ref 80.0–100.0)
Platelets: 546 10*3/uL — ABNORMAL HIGH (ref 150–400)
RBC: 4.35 MIL/uL (ref 3.87–5.11)
RDW: 13.6 % (ref 11.5–15.5)
WBC: 13 10*3/uL — ABNORMAL HIGH (ref 4.0–10.5)
nRBC: 0 % (ref 0.0–0.2)

## 2024-08-27 LAB — GLUCOSE, CAPILLARY
Glucose-Capillary: 112 mg/dL — ABNORMAL HIGH (ref 70–99)
Glucose-Capillary: 83 mg/dL (ref 70–99)
Glucose-Capillary: 93 mg/dL (ref 70–99)
Glucose-Capillary: 95 mg/dL (ref 70–99)
Glucose-Capillary: 98 mg/dL (ref 70–99)
Glucose-Capillary: 99 mg/dL (ref 70–99)

## 2024-08-27 MED ORDER — POLYETHYLENE GLYCOL 3350 17 G PO PACK: 17.0000 g | PACK | Freq: Every day | ORAL | Status: DC | PRN

## 2024-08-27 MED ORDER — QUETIAPINE FUMARATE 25 MG PO TABS: 12.5000 mg | ORAL_TABLET | Freq: Every evening | ORAL | Status: DC | PRN

## 2024-08-27 MED ORDER — FUROSEMIDE 10 MG/ML IJ SOLN
40.0000 mg | Freq: Once | INTRAMUSCULAR | Status: AC
  Administered 2024-08-27: 40 mg via INTRAVENOUS
  Filled 2024-08-27: qty 4

## 2024-08-27 MED ORDER — DILTIAZEM HCL 30 MG PO TABS
60.0000 mg | ORAL_TABLET | Freq: Four times a day (QID) | ORAL | Status: DC
  Administered 2024-08-28 (×2): 60 mg via ORAL
  Filled 2024-08-27 (×2): qty 2

## 2024-08-27 MED ORDER — FUROSEMIDE 10 MG/ML IJ SOLN
40.0000 mg | Freq: Two times a day (BID) | INTRAMUSCULAR | Status: DC
  Administered 2024-08-27 – 2024-08-28 (×4): 40 mg via INTRAVENOUS
  Filled 2024-08-27 (×4): qty 4

## 2024-08-27 MED ORDER — GABAPENTIN 250 MG/5ML PO SOLN
100.0000 mg | Freq: Three times a day (TID) | ORAL | Status: DC
  Administered 2024-08-27 (×2): 100 mg
  Filled 2024-08-27 (×4): qty 2

## 2024-08-27 MED ORDER — BISACODYL 10 MG RE SUPP: 10.0000 mg | Freq: Every day | RECTAL | Status: DC

## 2024-08-27 MED ORDER — IOHEXOL 350 MG/ML SOLN
75.0000 mL | Freq: Once | INTRAVENOUS | Status: AC | PRN
  Administered 2024-08-27: 75 mL via INTRAVENOUS

## 2024-08-27 MED ORDER — GABAPENTIN 250 MG/5ML PO SOLN: 100.0000 mg | Freq: Every day | ORAL | Status: DC

## 2024-08-27 MED ORDER — POTASSIUM CHLORIDE CRYS ER 20 MEQ PO TBCR
40.0000 meq | EXTENDED_RELEASE_TABLET | ORAL | Status: AC
  Administered 2024-08-27 (×2): 40 meq via ORAL
  Filled 2024-08-27 (×2): qty 2

## 2024-08-27 MED ORDER — GABAPENTIN 250 MG/5ML PO SOLN
100.0000 mg | Freq: Every day | ORAL | Status: DC
  Filled 2024-08-27: qty 2

## 2024-08-27 MED ORDER — OXYCODONE HCL 5 MG PO TABS
5.0000 mg | ORAL_TABLET | ORAL | Status: DC
  Administered 2024-08-27 (×3): 5 mg
  Filled 2024-08-27 (×3): qty 1

## 2024-08-27 MED ORDER — POLYETHYLENE GLYCOL 3350 17 G PO PACK
17.0000 g | PACK | Freq: Every day | ORAL | Status: DC
  Administered 2024-08-28: 17 g via ORAL
  Filled 2024-08-27: qty 1

## 2024-08-27 MED ORDER — BISACODYL 5 MG PO TBEC
10.0000 mg | DELAYED_RELEASE_TABLET | Freq: Every day | ORAL | Status: DC
  Administered 2024-08-27 – 2024-08-28 (×2): 10 mg via ORAL
  Filled 2024-08-27: qty 2

## 2024-08-27 MED ORDER — THIAMINE MONONITRATE 100 MG PO TABS
100.0000 mg | ORAL_TABLET | Freq: Every day | ORAL | Status: AC
  Administered 2024-08-28: 100 mg via ORAL
  Filled 2024-08-27: qty 1

## 2024-08-27 MED ORDER — BISACODYL 5 MG PO TBEC
10.0000 mg | DELAYED_RELEASE_TABLET | Freq: Every day | ORAL | Status: DC
  Filled 2024-08-27: qty 2

## 2024-08-27 MED ORDER — METOPROLOL TARTRATE 50 MG PO TABS
100.0000 mg | ORAL_TABLET | Freq: Two times a day (BID) | ORAL | Status: DC
  Administered 2024-08-27 – 2024-08-28 (×2): 100 mg via ORAL
  Filled 2024-08-27 (×2): qty 2

## 2024-08-27 MED ORDER — GABAPENTIN 250 MG/5ML PO SOLN
100.0000 mg | Freq: Every day | ORAL | Status: DC
  Administered 2024-08-27: 100 mg via ORAL
  Filled 2024-08-27 (×2): qty 2

## 2024-08-27 MED ORDER — OXYCODONE HCL 5 MG PO TABS
5.0000 mg | ORAL_TABLET | ORAL | Status: DC
  Administered 2024-08-27 – 2024-08-28 (×2): 5 mg via ORAL
  Filled 2024-08-27 (×2): qty 1

## 2024-08-27 MED ORDER — GABAPENTIN 250 MG/5ML PO SOLN
100.0000 mg | Freq: Three times a day (TID) | ORAL | Status: DC
  Administered 2024-08-28: 100 mg via ORAL
  Filled 2024-08-27 (×4): qty 2

## 2024-08-27 MED ORDER — ACETAMINOPHEN 325 MG PO TABS
650.0000 mg | ORAL_TABLET | Freq: Four times a day (QID) | ORAL | Status: DC
  Administered 2024-08-27 – 2024-08-28 (×2): 650 mg via ORAL
  Filled 2024-08-27 (×2): qty 2

## 2024-08-27 NOTE — Progress Notes (Signed)
" °  Progress Note    08/27/2024 4:09 PM * No surgery found *  Subjective: Minimally interactive today  Vitals:   08/27/24 1305 08/27/24 1400  BP: 101/87 (!) 111/55  Pulse:  (!) 57  Resp: 14 20  Temp:    SpO2:  97%    Physical Exam: She awakens but is mostly somnolent Abdomen is soft but distended Palpable distal pulses  CBC    Component Value Date/Time   WBC 13.0 (H) 08/27/2024 0257   RBC 4.35 08/27/2024 0257   HGB 12.2 08/27/2024 0257   HGB 10.8 (L) 01/02/2018 0920   HCT 39.3 08/27/2024 0257   PLT 546 (H) 08/27/2024 0257   PLT 604 (H) 01/02/2018 0920   MCV 90.3 08/27/2024 0257   MCH 28.0 08/27/2024 0257   MCHC 31.0 08/27/2024 0257   RDW 13.6 08/27/2024 0257   LYMPHSABS 2.1 01/02/2018 0920   MONOABS 0.8 01/02/2018 0920   EOSABS 0.1 01/02/2018 0920   BASOSABS 0.0 01/02/2018 0920    BMET    Component Value Date/Time   NA 138 08/27/2024 0257   K 3.6 08/27/2024 0257   CL 98 08/27/2024 0257   CO2 31 08/27/2024 0257   GLUCOSE 101 (H) 08/27/2024 0257   BUN 22 08/27/2024 0257   CREATININE 0.76 08/27/2024 0257   CREATININE 0.81 01/02/2018 0920   CALCIUM 9.3 08/27/2024 0257   GFRNONAA >60 08/27/2024 0257   GFRNONAA >60 01/02/2018 0920   GFRAA >60 01/02/2018 0920    INR    Component Value Date/Time   INR 1.0 08/19/2024 0910     Intake/Output Summary (Last 24 hours) at 08/27/2024 1609 Last data filed at 08/27/2024 1400 Gross per 24 hour  Intake 1259.11 ml  Output 1200 ml  Net 59.11 ml   CTA IMPRESSION: 1. Similar appearance of type B aortic dissection with intramural hematoma extending from the origin of the left subclavian artery down along the left lateral aortic arch and along the anterior descending thoracic aorta. 2. Small bilateral pleural effusions with partial compressive atelectasis of the lower lobes. Pneumonia is not excluded. 3. Small bowel obstruction with transition in the pelvis. Scattered pockets of air along the periphery of the small  bowel lumen may be intraluminal air or developing pneumatosis. 4. Colonic diverticulosis. 5.  Aortic Atherosclerosis (ICD10-I70.0).    Assessment/plan:  74 y.o. female is s/p here with type B aortic dissection with intramural hematoma.  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  Unlikely this is contributing to small bowel obstruction.  She does not require any further workup from a vascular standpoint I will set follow-up CTA in 6 weeks.   Lyle Niblett C. Sheree, MD [facility] Office: [phone] Pager: [phone]  08/27/2024 4:09 PM  "

## 2024-08-27 NOTE — Progress Notes (Signed)
 Physical Therapy Treatment Patient Details Name: Alexis Mcclain MRN: [id number] DOB: 02-May-1951 Today's Date: 08/27/2024   History of Present Illness A 74 yr old female patient with severe sharp chest pain radiating to the mid back with SOB on 1/20. 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  Treating medically without surgery. PMH: emphysema, and tobacco smoking (63 PY) on Albuterol  PRN    PT Comments  This session was limited by fatigue and pt needing increased time. Currently, pt is min A with bed mobility needing assistance bringing LLE onto bed. Pt is mod A for transfers, needing assistance for hand placement and to power up from chair. Pt has now started ambulating 11ft min A +2 (chair follow) with RW but is unable to walk further before fatigue becomes a limiting factor. Pt still continues to be limited by activity tolerance, endurance, mobility, and balance. Pt would still likely benefit from intensive inpatient rehab >3hrs/day to d/c home. Will continue to follow acutely.   If plan is discharge home, recommend the following: A lot of help with walking and/or transfers;A lot of help with bathing/dressing/bathroom;Assistance with cooking/housework;Assist for transportation;Help with stairs or ramp for entrance   Can travel by private vehicle        Equipment Recommendations  BSC/3in1;Rolling walker (2 wheels)    Recommendations for Other Services       Precautions / Restrictions Precautions Precautions: Fall Recall of Precautions/Restrictions: Intact Precaution/Restrictions Comments: [place], Watch O2 Restrictions Weight Bearing Restrictions Per Provider Order: No     Mobility  Bed Mobility Overal bed mobility: Needs Assistance Bed Mobility: Sit to Supine       Sit to supine: Min assist, HOB elevated, Used rails   General bed mobility comments: Pt was min A from sit to supine needing min A at the LLE to bring onto bed.  Pt needed extensive cues to come down on L elbow and bring legs onto bed.    Transfers Overall transfer level: Needs assistance Equipment used: Rolling walker (2 wheels) Transfers: Sit to/from Stand Sit to Stand: Mod assist, +2 safety/equipment           General transfer comment: Pt needing mod A to come to a full stand with cues for hand placement and cues to scoot forward to edge of chair. Pt needs increased time before standing. Pt with a flexed trunk and needing assistance to advance the walker and sequence the steps.    Ambulation/Gait Ambulation/Gait assistance: Min assist, +2 safety/equipment Gait Distance (Feet): 20 Feet Assistive device: Rolling walker (2 wheels) Gait Pattern/deviations: Step-to pattern, Decreased step length - right, Decreased step length - left, Trunk flexed, Narrow base of support, Knee flexed in stance - right, Knee flexed in stance - left Gait velocity: Reduced Gait velocity interpretation: <1.31 ft/sec, indicative of household ambulator   General Gait Details: Pt needing extensive cues for ambulating the RW with pt requring cues to remain proximal to the RW. Pt tended to keep a trunk flexed position throughtout ambulation and also needing cues to take steps back and forth and when making turns. Pt kept B knee in a increased flexion with a narrow base of support and tends to need increased time to advance the walker.   Stairs             Wheelchair Mobility     Tilt Bed    Modified Rankin (Stroke Patients Only)       Balance Overall balance  assessment: Needs assistance Sitting-balance support: Bilateral upper extremity supported, Feet supported Sitting balance-Leahy Scale: Poor Sitting balance - Comments: Reliant on recliner arm rests for BUE support.   Standing balance support: Bilateral upper extremity supported, During functional activity, Reliant on assistive device for balance Standing balance-Leahy Scale: Poor Standing balance  comment: Reliant on RW                            Communication Communication Communication: No apparent difficulties  Cognition Arousal: Alert Behavior During Therapy: Flat affect   PT - Cognitive impairments: Sequencing                       PT - Cognition Comments: Pt needing extensive cues for sequencing the RW and how to ambulate with RW. Following commands: Intact      Cueing Cueing Techniques: Verbal cues, Visual cues, Tactile cues  Exercises      General Comments General comments (skin integrity, edema, etc.): VSS on 4L O2. During ambulation, pt O2 dropped to 76% (pleth unreliable) but was able to remain above 90% when pleth was reliable on 4L O2.      Pertinent Vitals/Pain Pain Assessment Pain Assessment: Faces Faces Pain Scale: Hurts little more Pain Location: Chest and back Pain Descriptors / Indicators: Discomfort, Tiring Pain Intervention(s): Limited activity within patient's tolerance, Monitored during session    Home Living                          Prior Function            PT Goals (current goals can now be found in the care plan section) Acute Rehab PT Goals Patient Stated Goal: to go home PT Goal Formulation: With patient/family Time For Goal Achievement: 09/08/24 Potential to Achieve Goals: Good Progress towards PT goals: Progressing toward goals    Frequency    Min 2X/week      PT Plan      Co-evaluation              AM-PAC PT 6 Clicks Mobility   Outcome Measure  Help needed turning from your back to your side while in a flat bed without using bedrails?: A Little Help needed moving from lying on your back to sitting on the side of a flat bed without using bedrails?: A Little Help needed moving to and from a bed to a chair (including a wheelchair)?: A Lot Help needed standing up from a chair using your arms (e.g., wheelchair or bedside chair)?: A Lot Help needed to walk in hospital room?: A  Little Help needed climbing 3-5 steps with a railing? : Total 6 Click Score: 14    End of Session Equipment Utilized During Treatment: Gait belt;Oxygen Activity Tolerance: Patient tolerated treatment well;Patient limited by fatigue Patient left: in bed;with call bell/phone within reach;with bed alarm set;with family/visitor present   PT Visit Diagnosis: Unsteadiness on feet (R26.81);Muscle weakness (generalized) (M62.81);Difficulty in walking, not elsewhere classified (R26.2);Other abnormalities of gait and mobility (R26.89);Pain Pain - Right/Left:  (Chest and back) Pain - part of body:  (Chest and back)     Time: 8846-8772 PT Time Calculation (min) (ACUTE ONLY): 34 min  Charges:    $Gait Training: 8-22 mins $Therapeutic Activity: 8-22 mins PT General Charges $$ ACUTE PT VISIT: 1 Visit  Murtis CHRISTELLA Ferries, SPT    Saliyah Gillin 08/27/2024, 2:14 PM

## 2024-08-27 NOTE — Progress Notes (Addendum)
 Inpatient Rehab Admissions Coordinator:   Discussed with RN and pt able to start a diet this AM.  Will start insurance auth today.   1650: Note recurrent vomiting.  Stopped trickle feeds, but still on CLD.  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  Will follow for further workup/rx.   Reche Lowers, PT, DPT Admissions Coordinator [phone] 08/27/24 12:11 PM

## 2024-08-27 NOTE — Progress Notes (Signed)
 PCCM cross-cover:  Pt felt more dyspneic overnight and O2 was increased from 5L to 10L, received a duoneb and on my evaluation was comfortable with lungs mildly diminished and no rhonchi or wheezing.   Repeat CXR relatively unchanged, will give Lasix  40mg  x1 for mild pulmonary edema.  Leita SAUNDERS Anamari Galeas, PA-C

## 2024-08-27 NOTE — Progress Notes (Addendum)
" ° °  NAME:  Alexis Mcclain, MRN:  [id number], DOB:  1950-08-09, LOS: 8 ADMISSION DATE:  08/19/2024, CONSULTATION DATE:  08/19/2024 REFERRING MD:  Theadore, MD, CHIEF COMPLAINT:  chest pain 2:45 am   History of Present Illness:  A 74 yr old female patient with emphysema, and tobacco smoking (63 PY) on Albuterol  PRN, who awoke up at 2:45 am with severe sharp chest pain radiating to the mid back with SOB. She has chronic smoker cough and mild LL edema (stopped lasix  one year ago). No f/c/r, N/V, abd pain, syncope, wheezing, or hemoptysis. No alcohol or illicit drug use. No anti-PLT or AC meds. EMS gave her: 2 nito and 4 baby asa. Initial BP 212/108, then dropped to 134/82.  Pertinent  Medical History  COPD (emphysema), bladder Ca with tumor removal and chemoRx 2019.    Significant Hospital Events: Including procedures, antibiotic start and stop dates in addition to other pertinent events   1/20 admitted. ECHO LVEF 60-65%, RV fxn nml. Gd I diastolic dysfxn 1/21 still on esmolol  gtt. Still having some pain.  1/22 severe CP overnight, stat CT with stable dissection 1/23 agitated and received Haldol , started precedex   1/24 off precedex  1/26 Ileus>SBO via KUB, cortrak to LIS, starting to have evidence of BM  Interim History / Subjective:  Pain continues to be an issue This afternoon recurrent vomiting Some increase in dyspnea overnight improved with diuresis  Objective    Blood pressure 101/87, pulse (!) 54, temperature 98.8 F (37.1 C), temperature source Oral, resp. rate 14, height 5' 4 (1.626 m), weight 61 kg, SpO2 97%.        Intake/Output Summary (Last 24 hours) at 08/27/2024 1318 Last data filed at 08/27/2024 1200 Gross per 24 hour  Intake 1294.27 ml  Output 1200 ml  Net 94.27 ml   Filed Weights   08/25/24 0709 08/26/24 0500 08/27/24 0354  Weight: 72.5 kg 69.7 kg 61 kg    No distress Moves to command Abd soft, +BS Lungs sound okay  Labs reviewed  Resolved problem list    Assessment and Plan    Acute type B thoracic aortic intramural hematoma Acute encephalopathy due to ICU Delirium Borderline hypoglycemia Refractory N/V- issue again Pulmonary nodules- CT scan around June 2026 Acute on chronic respiratory failure with hypoxia Emphysema w/ on-going tobacco abuse;  1.5ppd smoker  SOB/hypoxemia- some improvement with diuresis, will continue  Stop trickle feeds again PRN nausea meds With ongoing pain and PO intolerance this far in going to re-scan, discussed with [facility] Needs to mobilize more Continue diuresis Pain meds adjusted With increasing O2 needs unfortunately stuck here  Rolan Sharps MD PCCM     "

## 2024-08-27 NOTE — Progress Notes (Signed)
 Brief Nutrition Follow-up:  Pt on trickle TF this AM in addition to CL diet; later RD notified by RN/MD that pt had vomited. TF now on hold  CT C/A/P ordered by MD. Further recommendations regarding nutrition pending results of CT. TF to remain on hold for now  Betsey Finger MS, RDN, LDN, CNSC Registered Dietitian 3 Clinical Nutrition RD Inpatient Contact Info in Amion

## 2024-08-28 ENCOUNTER — Other Ambulatory Visit: Payer: Self-pay

## 2024-08-28 ENCOUNTER — Inpatient Hospital Stay (HOSPITAL_COMMUNITY)

## 2024-08-28 ENCOUNTER — Encounter (HOSPITAL_COMMUNITY): Payer: Self-pay | Admitting: Pulmonary Disease

## 2024-08-28 DIAGNOSIS — J449 Chronic obstructive pulmonary disease, unspecified: Secondary | ICD-10-CM | POA: Diagnosis not present

## 2024-08-28 DIAGNOSIS — E876 Hypokalemia: Secondary | ICD-10-CM | POA: Diagnosis not present

## 2024-08-28 DIAGNOSIS — Z720 Tobacco use: Secondary | ICD-10-CM | POA: Diagnosis not present

## 2024-08-28 DIAGNOSIS — I71012 Dissection of descending thoracic aorta: Secondary | ICD-10-CM | POA: Diagnosis not present

## 2024-08-28 DIAGNOSIS — J9601 Acute respiratory failure with hypoxia: Secondary | ICD-10-CM | POA: Diagnosis not present

## 2024-08-28 DIAGNOSIS — K56609 Unspecified intestinal obstruction, unspecified as to partial versus complete obstruction: Secondary | ICD-10-CM | POA: Diagnosis not present

## 2024-08-28 LAB — BASIC METABOLIC PANEL WITH GFR
Anion gap: 14 (ref 5–15)
BUN: 21 mg/dL (ref 8–23)
CO2: 32 mmol/L (ref 22–32)
Calcium: 9.4 mg/dL (ref 8.9–10.3)
Chloride: 90 mmol/L — ABNORMAL LOW (ref 98–111)
Creatinine, Ser: 0.89 mg/dL (ref 0.44–1.00)
GFR, Estimated: >60 mL/min
Glucose, Bld: 91 mg/dL (ref 70–99)
Potassium: 4.4 mmol/L (ref 3.5–5.1)
Sodium: 136 mmol/L (ref 135–145)

## 2024-08-28 LAB — CBC
HCT: 39.6 % (ref 36.0–46.0)
Hemoglobin: 12.8 g/dL (ref 12.0–15.0)
MCH: 29 pg (ref 26.0–34.0)
MCHC: 32.3 g/dL (ref 30.0–36.0)
MCV: 89.8 fL (ref 80.0–100.0)
Platelets: 604 10*3/uL — ABNORMAL HIGH (ref 150–400)
RBC: 4.41 MIL/uL (ref 3.87–5.11)
RDW: 13.6 % (ref 11.5–15.5)
WBC: 14 10*3/uL — ABNORMAL HIGH (ref 4.0–10.5)
nRBC: 0 % (ref 0.0–0.2)

## 2024-08-28 LAB — POCT I-STAT 7, (LYTES, BLD GAS, ICA,H+H)
Acid-Base Excess: 10 mmol/L — ABNORMAL HIGH (ref 0.0–2.0)
Bicarbonate: 37 mmol/L — ABNORMAL HIGH (ref 20.0–28.0)
Calcium, Ion: 1.19 mmol/L (ref 1.15–1.40)
HCT: 38 % (ref 36.0–46.0)
Hemoglobin: 12.9 g/dL (ref 12.0–15.0)
O2 Saturation: 95 %
Patient temperature: 97.9
Potassium: 2.9 mmol/L — ABNORMAL LOW (ref 3.5–5.1)
Sodium: 135 mmol/L (ref 135–145)
TCO2: 39 mmol/L — ABNORMAL HIGH (ref 22–32)
pCO2 arterial: 56.1 mmHg — ABNORMAL HIGH (ref 32–48)
pH, Arterial: 7.426 (ref 7.35–7.45)
pO2, Arterial: 73 mmHg — ABNORMAL LOW (ref 83–108)

## 2024-08-28 LAB — GLUCOSE, CAPILLARY
Glucose-Capillary: 90 mg/dL (ref 70–99)
Glucose-Capillary: 98 mg/dL (ref 70–99)
Glucose-Capillary: 85 mg/dL (ref 70–99)
Glucose-Capillary: 92 mg/dL (ref 70–99)
Glucose-Capillary: 84 mg/dL (ref 70–99)

## 2024-08-28 LAB — RENAL FUNCTION PANEL
Albumin: 3.5 g/dL (ref 3.5–5.0)
Anion gap: 12 (ref 5–15)
BUN: 19 mg/dL (ref 8–23)
CO2: 35 mmol/L — ABNORMAL HIGH (ref 22–32)
Calcium: 9.9 mg/dL (ref 8.9–10.3)
Chloride: 89 mmol/L — ABNORMAL LOW (ref 98–111)
Creatinine, Ser: 0.81 mg/dL (ref 0.44–1.00)
GFR, Estimated: >60 mL/min
Glucose, Bld: 104 mg/dL — ABNORMAL HIGH (ref 70–99)
Phosphorus: 3.3 mg/dL (ref 2.5–4.6)
Potassium: 3.1 mmol/L — ABNORMAL LOW (ref 3.5–5.1)
Sodium: 136 mmol/L (ref 135–145)

## 2024-08-28 LAB — LACTIC ACID, PLASMA: Lactic Acid, Venous: 1.2 mmol/L (ref 0.5–1.9)

## 2024-08-28 LAB — CG4 I-STAT (LACTIC ACID): Lactic Acid, Venous: 0.5 mmol/L (ref 0.5–1.9)

## 2024-08-28 LAB — MAGNESIUM: Magnesium: 1.7 mg/dL (ref 1.7–2.4)

## 2024-08-28 MED ORDER — MAGNESIUM SULFATE 4 GM/100ML IV SOLN
4.0000 g | Freq: Once | INTRAVENOUS | Status: AC
  Administered 2024-08-28: 4 g via INTRAVENOUS
  Filled 2024-08-28: qty 100

## 2024-08-28 MED ORDER — ATROPINE SULFATE 1 MG/10ML IJ SOSY
PREFILLED_SYRINGE | INTRAMUSCULAR | Status: AC
  Filled 2024-08-28: qty 10

## 2024-08-28 MED ORDER — ACETAMINOPHEN 10 MG/ML IV SOLN
650.0000 mg | INTRAVENOUS | Status: AC
  Administered 2024-08-28 – 2024-08-29 (×5): 650 mg via INTRAVENOUS
  Filled 2024-08-28 (×3): qty 100
  Filled 2024-08-28: qty 65

## 2024-08-28 MED ORDER — SODIUM CHLORIDE 0.9% FLUSH: 10.0000 mL | INTRAVENOUS | Status: DC | PRN

## 2024-08-28 MED ORDER — ORAL CARE MOUTH RINSE: 15.0000 mL | OROMUCOSAL | Status: DC | PRN

## 2024-08-28 MED ORDER — GABAPENTIN 250 MG/5ML PO SOLN
100.0000 mg | Freq: Every day | ORAL | Status: DC
  Filled 2024-08-28: qty 2

## 2024-08-28 MED ORDER — METOPROLOL TARTRATE 50 MG PO TABS: 150.0000 mg | ORAL_TABLET | Freq: Two times a day (BID) | ORAL | Status: DC

## 2024-08-28 MED ORDER — LIDOCAINE HCL URETHRAL/MUCOSAL 2 % EX GEL
1.0000 | Freq: Once | CUTANEOUS | Status: AC
  Administered 2024-08-28: 1 via TOPICAL
  Filled 2024-08-28: qty 6

## 2024-08-28 MED ORDER — DIATRIZOATE MEGLUMINE & SODIUM 66-10 % PO SOLN
90.0000 mL | Freq: Once | ORAL | Status: AC
  Administered 2024-08-28: 90 mL via NASOGASTRIC
  Filled 2024-08-28: qty 90

## 2024-08-28 MED ORDER — SODIUM CHLORIDE 0.9% FLUSH
10.0000 mL | Freq: Two times a day (BID) | INTRAVENOUS | Status: DC
  Administered 2024-08-28: 20 mL
  Administered 2024-08-29 – 2024-09-02 (×9): 10 mL

## 2024-08-28 MED ORDER — ONDANSETRON HCL 4 MG/2ML IJ SOLN
4.0000 mg | Freq: Once | INTRAMUSCULAR | Status: AC
  Administered 2024-08-28: 4 mg via INTRAVENOUS
  Filled 2024-08-28: qty 2

## 2024-08-28 MED ORDER — POTASSIUM CHLORIDE 10 MEQ/50ML IV SOLN
10.0000 meq | INTRAVENOUS | Status: AC
  Administered 2024-08-28 (×6): 10 meq via INTRAVENOUS
  Filled 2024-08-28 (×6): qty 50

## 2024-08-28 MED ORDER — QUETIAPINE FUMARATE 25 MG PO TABS: 12.5000 mg | ORAL_TABLET | Freq: Every evening | ORAL | Status: DC | PRN

## 2024-08-28 MED ORDER — POTASSIUM CHLORIDE 10 MEQ/100ML IV SOLN: 10.0000 meq | INTRAVENOUS | Status: DC

## 2024-08-28 MED ORDER — GABAPENTIN 250 MG/5ML PO SOLN
100.0000 mg | Freq: Three times a day (TID) | ORAL | Status: DC
  Filled 2024-08-28 (×2): qty 2

## 2024-08-28 MED ORDER — NICARDIPINE HCL IN NACL 20-0.86 MG/200ML-% IV SOLN
3.0000 mg/h | INTRAVENOUS | Status: DC
  Administered 2024-08-29 (×2): 5 mg/h via INTRAVENOUS
  Administered 2024-08-29 (×2): 7.5 mg/h via INTRAVENOUS
  Filled 2024-08-28 (×4): qty 200

## 2024-08-28 NOTE — Progress Notes (Signed)
 Peripherally Inserted Central Catheter Placement  The IV Nurse has discussed with the patient and/or persons authorized to consent for the patient, the purpose of this procedure and the potential benefits and risks involved with this procedure.  The benefits include less needle sticks, lab draws from the catheter, and the patient may be discharged home with the catheter. Risks include, but not limited to, infection, bleeding, blood clot (thrombus formation), and puncture of an artery; nerve damage and irregular heartbeat and possibility to perform a PICC exchange if needed/ordered by physician.  Alternatives to this procedure were also discussed.  Bard Power PICC patient education guide, fact sheet on infection prevention and patient information card has been provided to patient /or left at bedside.  [place], RN aware all new tubing needs to be used for PICC access.  PICC Placement Documentation  PICC Double Lumen 08/28/24 Right Brachial 36 cm 0 cm (Active)  Indication for Insertion or Continuance of Line Administration of hyperosmolar/irritating solutions (i.e. TPN, Vancomycin , etc.) 08/28/24 2111  Exposed Catheter (cm) 0 cm 08/28/24 2111  Site Assessment Clean, Dry, Intact 08/28/24 2111  Lumen #1 Status Flushed;Saline locked;Blood return noted 08/28/24 2111  Lumen #2 Status Flushed;Saline locked;Blood return noted 08/28/24 2111  Dressing Type Transparent;Securing device 08/28/24 2111  Dressing Status Antimicrobial disc/dressing in place;Clean, Dry, Intact 08/28/24 2111  Line Care Connections checked and tightened 08/28/24 2111  Line Adjustment (NICU/IV Team Only) No 08/28/24 2111  Dressing Intervention New dressing;Adhesive placed at insertion site (IV team only) 08/28/24 2111  Dressing Change Due 09/04/24 08/28/24 2111       Sole Lengacher, [place]rene Place 08/28/2024, 9:12 PM

## 2024-08-28 NOTE — Progress Notes (Signed)
 Right Basilic vein noted to be noncompressible with assessment for PICC access.

## 2024-08-28 NOTE — Progress Notes (Addendum)
 "  NAME:  Alexis Mcclain, MRN:  [id number], DOB:  [birth year], LOS: 9 ADMISSION DATE:  08/19/2024, CONSULTATION DATE:  08/19/2024 REFERRING MD:  Theadore, MD, CHIEF COMPLAINT:  chest pain 2:45 am   History of Present Illness:  A 74 yr old female patient with emphysema, and tobacco smoking (63 PY) on Albuterol  PRN, who awoke up at 2:45 am with severe sharp chest pain radiating to the mid back with SOB. She has chronic smoker cough and mild LL edema (stopped lasix  one year ago). No f/c/r, N/V, abd pain, syncope, wheezing, or hemoptysis. No alcohol or illicit drug use. No anti-PLT or AC meds. EMS gave her: 2 nito and 4 baby asa. Initial BP 212/108, then dropped to 134/82.  Pertinent  Medical History  COPD (emphysema), bladder Ca with tumor removal and chemoRx 2019.    Significant Hospital Events: Including procedures, antibiotic start and stop dates in addition to other pertinent events   1/20 admitted. ECHO LVEF 60-65%, RV fxn nml. Gd I diastolic dysfxn 1/21 still on esmolol  gtt. Still having some pain.  1/22 severe CP overnight, stat CT with stable dissection 1/23 agitated and received Haldol , started precedex   1/24 off precedex  1/26 Ileus>SBO via KUB, cortrak to LIS, starting to have evidence of BM 1/28 still had abd pain. CT w/ obstruction, possible pneumotosis w/ transition point 1/29 surgical team consulted to eval abd   Interim History / Subjective:  No pain or nausea. Bm last night  Abd still uncomfortable   Objective    Blood pressure (!) 112/53, pulse 63, temperature 98 F (36.7 C), temperature source Oral, resp. rate 15, height 5' 4 (1.626 m), weight 63.1 kg, SpO2 (!) 88%.        Intake/Output Summary (Last 24 hours) at 08/28/2024 1020 Last data filed at 08/28/2024 0400 Gross per 24 hour  Intake 726.67 ml  Output 50 ml  Net 676.67 ml   Filed Weights   08/26/24 0500 08/27/24 0354 08/28/24 0435  Weight: 69.7 kg 61 kg 63.1 kg  General this is a 74 year old female. She is up  in chair no distress HENT NCAT no JVD  Pulm dec both bases. Speaking in full sentences. No distress but is still on 5 lpm. CT w/ bilateral effusions and RBasilar atx  Card rrr  Abd soft Ext warm dry no parastesthesia. Dependent edema  Gu void Neuro intact and w/out deficit    Resolved problem list   Acute encephalopathy due to ICU Delirium Assessment and Plan    Acute type B thoracic aortic intramural hematoma -clinically and radiographically stabilized -vascular has signed off Plan Cont antihypertensives and diuretics.  May need to change back to IV depending on surgical team recs F/u  CT 6 weeks Cont SCD for DVT prophylaxis.   SBO w/ transition point. Refractory N&V has improved some. Currently tol sips/clears BM last evening Plan Cont sips only for now may need to stop oral administration  Surgical team consulted.  Has small bore feeding tube. We were able to decompress bowel previously w/ this so we can resume LIWS to this if surgical team feels warranted If We need to Keep NPO will d/w team TPN at this point given how long she has been here  Stop scheduled narcotics Mobilize K > 4. Mg > 2  Acute on chronic respiratory failure with hypoxia Emphysema w/ on-going tobacco abuse;  1.5ppd smoker  SOB/hypoxemia- some improvement with diuresis, but feel this is primarily atelectasis and dec'd abd compliance.  CT small bilateral effusions. R basilar atx  Still 5 l [place] Plan Cont to mobilize Cont BDs (brovana  and yupelri  w/ PRN duoneb) Add flutter Cont diuresis as tolerated (still volume overloaded) Cont pulse ox  Wean o2  Fluid and electrolyte status. Total volume overload, hypokalemia, hypomagnesemia Plan K replaced Cont Lasix   Trend lytes   Pulmonary nodules Plan CT scan around June 2026 Appointment made    I personally  spent 35 minutes  on this patient which included: review of medical records, nursing notes, progress notes, evaluation, interpretation of lab  data and diagnostic studies, taking independent history, performing exam, documenting plan, ordering diagnostics and interventions for the following critical care issues: Severe metabolic derangements with the following interventions which included: prevention of further deterioration       "

## 2024-08-28 NOTE — Consult Note (Signed)
 "    Alexis Mcclain [birth year]  [id number].    Requesting MD: Dr. Leita Gaskins Chief Complaint/Reason for Consult: SBO  HPI:  This is a 74 yo white female who has been admitted secondary to a Type B aortic dissection, with acute on chronic respiratory failure, tobacco abuse, and h/o bladder cancer.  She was initially felt to have an ileus during her stay for this dissection.  She had a Cortrak placed on 1/26 for decompression it appears.  She denies having abdominal pain, but admits to N/V, at least 5 times, last episode being this morning.  She did have a BM last night as well.  Due to persistent distention she underwent a new CT scan yesterday.  This now revealed evidence of a transition point more c/w a bowel obstruction instead of an ileus.  There was a concern for possible developing pneumatosis.  Her WBC is 14K.  Lactic acid is pending.  We have been asked to see her for further evaluation and management.  ROS: ROS: see HPI  History reviewed. No pertinent family history.  Past Medical History:  Diagnosis Date   Anxiety    Cancer (HCC)    bladder    Dizzy    occ last week or so   Dyspnea    with exertion   Hematuria last 3 months   History of blood transfusion 1980   after surgery for ovarian cyst rupture lost pregnancy   UTI (urinary tract infection)    on ciprofloxacin    Past Surgical History:  Procedure Laterality Date   ABDOMINAL HYSTERECTOMY  1990's   partial 1 ovary left   colonscopy  yrs ago   IR FLUORO GUIDE PORT INSERTION RIGHT  12/10/2017   IR US  GUIDE VASC ACCESS RIGHT  12/10/2017   surgery for ovarian cyst   1990's   TRANSURETHRAL RESECTION OF BLADDER TUMOR N/A 10/26/2017   Procedure: TRANSURETHRAL RESECTION OF BLADDER TUMOR / CYSTOSCOPY(TURBT);  Surgeon: Devere Lonni Righter, MD;  Location: [facility];  Service: Urology;  Laterality: N/A;  ONLY NEEDS 90 MIN   TUBAL LIGATION  1981   laparoscopic    Social History:  reports that she has  been smoking cigarettes. She has a 63 pack-year smoking history. She has never used smokeless tobacco. She reports that she does not drink alcohol and does not use drugs.  Allergies: Allergies[1]  Medications Prior to Admission  Medication Sig Dispense Refill   albuterol  (VENTOLIN  HFA) 108 (90 Base) MCG/ACT inhaler Inhale 2 puffs into the lungs every 6 (six) hours as needed for wheezing.     ibuprofen (ADVIL) 200 MG tablet Take 200-400 mg by mouth daily as needed for moderate pain (pain score 4-6).     Moringa Oleifera (MORINGA PO) Take 1 capsule by mouth daily.       Physical Exam: Blood pressure (!) 130/59, pulse 63, temperature 97.9 F (36.6 C), temperature source Oral, resp. rate 17, height 5' 4 (1.626 m), weight 63.1 kg, SpO2 100%. General: pleasant, WD, WN white female who is sitting up in her chair in NAD HEENT: head is normocephalic, atraumatic.  Sclera are noninjected.  PERRL.  Ears and nose without any masses or lesions, but she does have a Cortrak in her right nares.  Mouth is pink and dry Heart: regular, rate, and rhythm.   Lungs: CTAB, Respiratory effort nonlabored Abd: soft, but distended, minimally tender at best in central abdomen, no masses, hernias, or organomegaly, lower incision noted from previous  abdominal surgery Psych: A&Ox3 with an appropriate affect.   Results for orders placed or performed during the hospital encounter of 08/19/24 (from the past 48 hours)  Glucose, capillary     Status: None   Collection Time: 08/26/24 12:05 PM  Result Value Ref Range   Glucose-Capillary 98 70 - 99 mg/dL    Comment: Glucose reference range applies only to samples taken after fasting for at least 8 hours.  Glucose, capillary     Status: None   Collection Time: 08/26/24  4:38 PM  Result Value Ref Range   Glucose-Capillary 97 70 - 99 mg/dL    Comment: Glucose reference range applies only to samples taken after fasting for at least 8 hours.  Glucose, capillary     Status:  Abnormal   Collection Time: 08/26/24  8:26 PM  Result Value Ref Range   Glucose-Capillary 105 (H) 70 - 99 mg/dL    Comment: Glucose reference range applies only to samples taken after fasting for at least 8 hours.  Glucose, capillary     Status: None   Collection Time: 08/26/24 11:43 PM  Result Value Ref Range   Glucose-Capillary 99 70 - 99 mg/dL    Comment: Glucose reference range applies only to samples taken after fasting for at least 8 hours.  CBC     Status: Abnormal   Collection Time: 08/27/24  2:57 AM  Result Value Ref Range   WBC 13.0 (H) 4.0 - 10.5 K/uL   RBC 4.35 3.87 - 5.11 MIL/uL   Hemoglobin 12.2 12.0 - 15.0 g/dL   HCT 60.6 63.9 - 53.9 %   MCV 90.3 80.0 - 100.0 fL   MCH 28.0 26.0 - 34.0 pg   MCHC 31.0 30.0 - 36.0 g/dL   RDW 86.3 88.4 - 84.4 %   Platelets 546 (H) 150 - 400 K/uL   nRBC 0.0 0.0 - 0.2 %    Comment: Performed at [facility], [address] [place], [place] [address]  Basic metabolic panel with GFR     Status: Abnormal   Collection Time: 08/27/24  2:57 AM  Result Value Ref Range   Sodium 138 135 - 145 mmol/L   Potassium 3.6 3.5 - 5.1 mmol/L   Chloride 98 98 - 111 mmol/L   CO2 31 22 - 32 mmol/L   Glucose, Bld 101 (H) 70 - 99 mg/dL    Comment: Glucose reference range applies only to samples taken after fasting for at least 8 hours.   BUN 22 8 - 23 mg/dL   Creatinine, Ser 9.23 0.44 - 1.00 mg/dL   Calcium 9.3 8.9 - 89.6 mg/dL   GFR, Estimated >39 >39 mL/min    Comment: (NOTE) Calculated using the CKD-EPI Creatinine Equation (2021)    Anion gap 10 5 - 15    Comment: Performed at [facility], [address] [place], [place] [address]  Glucose, capillary     Status: None   Collection Time: 08/27/24  4:06 AM  Result Value Ref Range   Glucose-Capillary 99 70 - 99 mg/dL    Comment: Glucose reference range applies only to samples taken after fasting for at least 8 hours.  Glucose, capillary     Status: None   Collection Time: 08/27/24  8:05  AM  Result Value Ref Range   Glucose-Capillary 98 70 - 99 mg/dL    Comment: Glucose reference range applies only to samples taken after fasting for at least 8 hours.  Glucose,  capillary     Status: Abnormal   Collection Time: 08/27/24 12:27 PM  Result Value Ref Range   Glucose-Capillary 112 (H) 70 - 99 mg/dL    Comment: Glucose reference range applies only to samples taken after fasting for at least 8 hours.  Glucose, capillary     Status: None   Collection Time: 08/27/24  5:02 PM  Result Value Ref Range   Glucose-Capillary 93 70 - 99 mg/dL    Comment: Glucose reference range applies only to samples taken after fasting for at least 8 hours.  Glucose, capillary     Status: None   Collection Time: 08/27/24  7:43 PM  Result Value Ref Range   Glucose-Capillary 95 70 - 99 mg/dL    Comment: Glucose reference range applies only to samples taken after fasting for at least 8 hours.  Glucose, capillary     Status: None   Collection Time: 08/27/24 11:38 PM  Result Value Ref Range   Glucose-Capillary 83 70 - 99 mg/dL    Comment: Glucose reference range applies only to samples taken after fasting for at least 8 hours.  Magnesium      Status: None   Collection Time: 08/28/24  3:21 AM  Result Value Ref Range   Magnesium  1.7 1.7 - 2.4 mg/dL    Comment: Performed at [facility], [address], [place], [place] [address]  CBC     Status: Abnormal   Collection Time: 08/28/24  3:21 AM  Result Value Ref Range   WBC 14.0 (H) 4.0 - 10.5 K/uL   RBC 4.41 3.87 - 5.11 MIL/uL   Hemoglobin 12.8 12.0 - 15.0 g/dL   HCT 60.3 63.9 - 53.9 %   MCV 89.8 80.0 - 100.0 fL   MCH 29.0 26.0 - 34.0 pg   MCHC 32.3 30.0 - 36.0 g/dL   RDW 86.3 88.4 - 84.4 %   Platelets 604 (H) 150 - 400 K/uL   nRBC 0.0 0.0 - 0.2 %    Comment: Performed at [facility], [address], [place], [place] [address]  Glucose, capillary     Status: None   Collection Time: 08/28/24  4:06 AM  Result Value Ref Range    Glucose-Capillary 90 70 - 99 mg/dL    Comment: Glucose reference range applies only to samples taken after fasting for at least 8 hours.  Glucose, capillary     Status: None   Collection Time: 08/28/24  7:46 AM  Result Value Ref Range   Glucose-Capillary 98 70 - 99 mg/dL    Comment: Glucose reference range applies only to samples taken after fasting for at least 8 hours.  Renal function panel     Status: Abnormal   Collection Time: 08/28/24  8:56 AM  Result Value Ref Range   Sodium 136 135 - 145 mmol/L   Potassium 3.1 (L) 3.5 - 5.1 mmol/L   Chloride 89 (L) 98 - 111 mmol/L   CO2 35 (H) 22 - 32 mmol/L   Glucose, Bld 104 (H) 70 - 99 mg/dL    Comment: Glucose reference range applies only to samples taken after fasting for at least 8 hours.   BUN 19 8 - 23 mg/dL   Creatinine, Ser 9.18 0.44 - 1.00 mg/dL   Calcium 9.9 8.9 - 89.6 mg/dL   Phosphorus 3.3 2.5 - 4.6 mg/dL   Albumin 3.5 3.5 - 5.0 g/dL   GFR, Estimated >39 >39 mL/min    Comment: (NOTE) Calculated using  the CKD-EPI Creatinine Equation (2021)    Anion gap 12 5 - 15    Comment: Performed at [facility], [address], [place], [place] [address]  Glucose, capillary     Status: None   Collection Time: 08/28/24 11:07 AM  Result Value Ref Range   Glucose-Capillary 85 70 - 99 mg/dL    Comment: Glucose reference range applies only to samples taken after fasting for at least 8 hours.   CT Angio Chest/Abd/Pel for Dissection W and/or W/WO Result Date: 08/27/2024 CLINICAL DATA:  Follow-up aortic dissection. EXAM: CT ANGIOGRAPHY CHEST, ABDOMEN AND PELVIS TECHNIQUE: Non-contrast CT of the chest was initially obtained. Multidetector CT imaging through the chest, abdomen and pelvis was performed using the standard protocol during bolus administration of intravenous contrast. Multiplanar reconstructed images and MIPs were obtained and reviewed to evaluate the vascular anatomy. RADIATION DOSE REDUCTION: This exam was performed according to  the departmental dose-optimization program which includes automated exposure control, adjustment of the mA and/or kV according to patient size and/or use of iterative reconstruction technique. CONTRAST:  75mL OMNIPAQUE  IOHEXOL  350 MG/ML SOLN COMPARISON:  CT dated 08/21/2024. FINDINGS: CTA CHEST FINDINGS Cardiovascular: There is no cardiomegaly or pericardial effusion. Atherosclerotic calcification of the thoracic aorta. Similar appearance of a type B aortic dissection with intramural hematoma extending from the origin of the left subclavian artery down along the left lateral aortic arch and along the anterior descending thoracic aorta. The intramural hematoma measures approximately 8 mm in thickness similar to prior CT. The origins of the great vessels of the aortic arch and the central pulmonary arteries appear patent. Mediastinum/Nodes: No hilar or mediastinal adenopathy. An enteric tube is noted in the esophagus. There is a small hiatal hernia. Lungs/Pleura: Small bilateral pleural effusions similar to prior CT. There is partial compressive atelectasis of the lower lobes. Pneumonia is not excluded. There is background of emphysema. No pneumothorax. The central airways are patent. Musculoskeletal: No acute osseous pathology. Review of the MIP images confirms the above findings. CTA ABDOMEN AND PELVIS FINDINGS VASCULAR Aorta: Advanced calcified and noncalcified plaque of the abdominal aorta. The intramural hematoma of the thoracic aorta terminates at or just inferior to the level of the SMA. No dissection of the abdominal aorta. Celiac: There is focal narrowing of the proximal celiac trunk. The celiac artery and its major branches are patent. SMA: The SMA is patent. Renals: The renal arteries are patent. IMA: The IMA is patent. Inflow: Moderate atherosclerotic calcification of the iliac arteries. No aneurysmal dilatation or dissection. Veins: No obvious venous abnormality within the limitations of this arterial  phase study. Review of the MIP images confirms the above findings. NON-VASCULAR No intra-abdominal free air or free fluid. Hepatobiliary: Slight irregularity of the liver contour suspicious for changes of cirrhosis. No biliary ductal dilatation. The gallbladder is unremarkable. Pancreas: Unremarkable. No pancreatic ductal dilatation or surrounding inflammatory changes. Spleen: Normal in size without focal abnormality. Adrenals/Urinary Tract: Bilateral adrenal nodules as seen on the prior CT. No hydronephrosis on either side. The visualized ureters and urinary bladder appear unremarkable. Stomach/Bowel: Enteric tube with tip in the region of the duodenal bulb. There is distal colonic diverticulosis. Diffusely dilated and fluid-filled loops of small bowel measure up to 4 cm in caliber. A transition is noted in the pelvis (227/6). Scattered pockets of air along the periphery of the small bowel lumen may be intraluminal air or developing pneumatosis. The appendix is normal. Lymphatic: No adenopathy. Reproductive: Hysterectomy.  No suspicious adnexal masses.  Other: None Musculoskeletal: Osteopenia with degenerative changes of the spine. No acute osseous pathology. Review of the MIP images confirms the above findings. IMPRESSION: 1. Similar appearance of type B aortic dissection with intramural hematoma extending from the origin of the left subclavian artery down along the left lateral aortic arch and along the anterior descending thoracic aorta. 2. Small bilateral pleural effusions with partial compressive atelectasis of the lower lobes. Pneumonia is not excluded. 3. Small bowel obstruction with transition in the pelvis. Scattered pockets of air along the periphery of the small bowel lumen may be intraluminal air or developing pneumatosis. 4. Colonic diverticulosis. 5.  Aortic Atherosclerosis (ICD10-I70.0). Electronically Signed   By: Vanetta Chou M.D.   On: 08/27/2024 16:06   DG CHEST PORT 1 VIEW Result Date:  08/27/2024 EXAM: 1 VIEW(S) XRAY OF THE CHEST 08/27/2024 01:42:00 AM COMPARISON: 08/26/2024 CLINICAL HISTORY: Shortness of breath. FINDINGS: LINES, TUBES AND DEVICES: Right chest port in place with tip at the cavoatrial junction. Enteric tube in place, courses below the diaphragm with distal tip beyond the inferior margin of the film. LUNGS AND PLEURA: Similar bibasilar opacities. Small bilateral pleural effusions. No pneumothorax. HEART AND MEDIASTINUM: Stable cardiomegaly. BONES AND SOFT TISSUES: No acute osseous abnormality. IMPRESSION: 1. Similar bibasilar opacities and small bilateral pleural effusions. 2. Feeding catheter extends into the stomach. Electronically signed by: Oneil Devonshire MD 08/27/2024 01:46 AM EST RP Workstation: [id number]    Assessment/Plan SBO  The patient has been seen, examined, labs, vitals, chart, and imaging personally reviewed.  The patient appears to have a SBO on her new CT scan with a transition point.  The CT questions some possible pneumatosis in one loop of SB.  On review of her scan, she does appear to possibly have some swirling of her mesenteric vessels.  However, she is really not tender on exam.  She has a lactic acid level pending.  We will follow up on this lab.  For now though, the patient appears stable and we will DC her Cortrak and place an NGT and star the SBO protocol.  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  Hopefully we can get her better with conservative management.   FEN - NPO/NGT/IVFs per primary VTE - SCDs ID - none currently needed  Type B aortic dissection Acute on chronic respiratory failure Tobacco abuse H/O bladder cancer  I reviewed Consultant pulmonary/CCM notes, last 24 h vitals and pain scores, last 48 h intake and output, last 24 h labs and trends, and last 24 h imaging results.  Burnard FORBES Banter, PA-C [facility] 08/28/2024, 11:54 AM Please see Amion for pager number during  day hours 7:00am-4:30pm or 7:00am -11:30am on weekends      [1]  Allergies Allergen Reactions   Nickel     Whelps   Tape Other (See Comments)    Burns skin    Hurt when pulled off   "

## 2024-08-28 NOTE — Progress Notes (Addendum)
 PHARMACY - TOTAL PARENTERAL NUTRITION CONSULT NOTE   Indication: Small bowel obstruction  Patient Measurements: Height: 5' 4 (162.6 cm) Weight: 63.1 kg (139 lb 1.8 oz) IBW/kg (Calculated) : 54.7 TPN AdjBW (KG): 58.1 Body mass index is 23.88 kg/m. Usual Weight: appears around 67kg PTA   Assessment:  Patient admitted on 1/20 with CC of chest pain found to have Type B aortic dissection. Throughout hospital stay thought to have an ileus. CT abdomen overnight found to have an small bowel obstruction.Surgery consulted, planning conservative management of SBO, placing NG tube today.   Patient has had minimal PO intake throughout hospital stay thus far.  Pharmacy consulted to dose TPN.   Glucose / Insulin: A1C 5.8%, no hx of DM  Electrolytes: Na: 136, K: 3.1, HCO3: 35, Cl: 89, phos: 3.3, ical: 1.19  Renal: Scr WNL  Hepatic: LFTS on admission WNL  Intake / Output; MIVF: currently getting lasix  40mg  BID, UOP 250 charted with 3x urine occurrence, net + 3.2L, LBM 1/28   GI Imaging: 1/28 CT chest abdomen pelvis: stable Type B dissection, SBO with transition in pelvis, pockets of air in the small bowel lumen  GI Surgeries / Procedures:   Central access: PICC line ordered  TPN start date: 1/30   RD Assessment: Estimated Needs Total Energy Estimated Needs: 1600-1800 kcals Total Protein Estimated Needs: 75-95 g Total Fluid Estimated Needs: >/= 1.6 L  Current Nutrition:  NPO  Plan:  TPN to start tomorrow given consult placed after 12:00.  CMP ordered for 1/30.   Powell Blush, PharmD, BCCCP  08/28/2024,1:01 PM

## 2024-08-28 NOTE — Progress Notes (Signed)
 Spoke w/ surgical team Appreciate their assist Plan SBO protocol  Attempting NGT if pt will allow o/w will make due w/ the cor trak for sxn Strict NPO except ice chips She has PRN orders for antihypertensives but stopping her oral BB may result in us  needing to go back on gtt.   Orders placed.

## 2024-08-28 NOTE — Progress Notes (Signed)
 Nutrition Follow-up  DOCUMENTATION CODES:   Not applicable  INTERVENTION:   Recommend initiation of TPN. LOS day 9 with minimal intake, intolerance of trickle TF, now SBO per CTA. Discussed with PCCM, plan for  initiation of TPN Recommend addition of 100 mg IV thiamine  Monitor magnesium , potassium, and phosphorus daily for at least 3 days, MD to replete as needed, as pt is at risk for refeeding syndrome    NUTRITION DIAGNOSIS:   Inadequate oral intake related to acute illness as evidenced by NPO status.  Continues but being addressed as plan for TPN  GOAL:   Patient will meet greater than or equal to 90% of their needs  Progression  MONITOR:   PO intake, TF tolerance, Labs, Weight trends  REASON FOR ASSESSMENT:   Consult Enteral/tube feeding initiation and management, Assessment of nutrition requirement/status  ASSESSMENT:   74 yo female admitted with acute type B thoracic intramural hematoma-being medically managed at present. PMH includes COPD with ongoing tobacco abuse, hx of bladder cancer with tumor removal and chemo in 2019  1/20 Admitted, CTA A/P:  acute type B thoracic aortic intramural hematoma, cirrhotic changes of liver with mild steatosis, +LLL and RUL lung nodules, L renal lesion, L adrenal lesion 1/22 Repeat CT with stable type B aortic dissection 1/23 Cortrak placed, TF initiated  1/25 +Emesis, TF held-unable to place NG, Cortrak used for decompression, abd xray with ileus per MD 1/27 Trickle TF resumed 1/28 +emesis on trickle TF. CTA C/A/P with SBO with transition in the pelvis; stable Type B aortic dissection with intramural hematoma  CTA C/A/P yesterday: SBO with transition point in pelvis. Surgery consulted, plan for SBO protocol with conservative management for now  CL diet remains active this AM, pt taking sips of CL, later made NPO Cortrak in place and previously used for decompression' attempt at NG placement not successful over the weekend, pt  not wanting additional tube placed. Recommend changing out for NG for decompression if able  GI: large BM yesterday but also +emesis, +nausea  Current Weight: 63.1 kg Admission Weight: 67.9 kg (1/21), admission wt on 1/20 likely ED wt-stated/estimated 58.1 kg (128 pounds) High wt this admission 75.4 kg; wt yesterday 61 kg  I/O: UOP 250 mL plus 3 unmeasured urine occurrences  Lines/Drains: Cortrak PICC Line ordered, to be placed  Labs: CBGs 83-98 Magnesium  1.7 (wdl) Potassium 3.1 (wdl) Phosphorus 3.3 (wdl) Sodium 136 (wdl) BUN/Creatinine wdl  Meds: Dulcolax Lasix   Kcl   Diet Order:   Diet Order             Diet NPO time specified Except for: Ice Chips  Diet effective now                   EDUCATION NEEDS:   Education needs have been addressed (family education, pt altered)  Skin:  Skin Assessment: Reviewed RN Assessment  Last BM:  PTA, +flatus, +BS  Height:   Ht Readings from Last 1 Encounters:  08/22/24 5' 4 (1.626 m)    Weight:   Wt Readings from Last 1 Encounters:  08/28/24 63.1 kg     BMI:  Body mass index is 23.88 kg/m.  Estimated Nutritional Needs:   Kcal:  1600-1800 kcals  Protein:  75-95 g  Fluid:  >/= 1.6 L   Betsey Finger MS, RDN, LDN, CNSC Registered Dietitian 3 Clinical Nutrition RD Inpatient Contact Info in Amion

## 2024-08-28 NOTE — Progress Notes (Signed)
 RT NOTE: PT up in chair at this time eating breakfast. CPT held at this time.

## 2024-08-28 NOTE — Plan of Care (Signed)
  Problem: Nutrition: Goal: Adequate nutrition will be maintained Outcome: Progressing   Problem: Coping: Goal: Level of anxiety will decrease Outcome: Progressing   Problem: Safety: Goal: Ability to remain free from injury will improve Outcome: Progressing   Problem: Skin Integrity: Goal: Risk for impaired skin integrity will decrease Outcome: Progressing   

## 2024-08-29 ENCOUNTER — Inpatient Hospital Stay (HOSPITAL_COMMUNITY)

## 2024-08-29 DIAGNOSIS — J9621 Acute and chronic respiratory failure with hypoxia: Secondary | ICD-10-CM | POA: Diagnosis not present

## 2024-08-29 DIAGNOSIS — Z720 Tobacco use: Secondary | ICD-10-CM | POA: Diagnosis not present

## 2024-08-29 DIAGNOSIS — R918 Other nonspecific abnormal finding of lung field: Secondary | ICD-10-CM | POA: Diagnosis not present

## 2024-08-29 DIAGNOSIS — K56609 Unspecified intestinal obstruction, unspecified as to partial versus complete obstruction: Secondary | ICD-10-CM | POA: Diagnosis not present

## 2024-08-29 DIAGNOSIS — I7100 Dissection of unspecified site of aorta: Secondary | ICD-10-CM | POA: Diagnosis not present

## 2024-08-29 LAB — COMPREHENSIVE METABOLIC PANEL WITH GFR
ALT: 23 U/L (ref 0–44)
AST: 32 U/L (ref 15–41)
Albumin: 3.2 g/dL — ABNORMAL LOW (ref 3.5–5.0)
Alkaline Phosphatase: 113 U/L (ref 38–126)
Anion gap: 16 — ABNORMAL HIGH (ref 5–15)
BUN: 23 mg/dL (ref 8–23)
CO2: 33 mmol/L — ABNORMAL HIGH (ref 22–32)
Calcium: 9.4 mg/dL (ref 8.9–10.3)
Chloride: 87 mmol/L — ABNORMAL LOW (ref 98–111)
Creatinine, Ser: 1.01 mg/dL — ABNORMAL HIGH (ref 0.44–1.00)
GFR, Estimated: 59 mL/min — ABNORMAL LOW
Glucose, Bld: 74 mg/dL (ref 70–99)
Potassium: 3.6 mmol/L (ref 3.5–5.1)
Sodium: 137 mmol/L (ref 135–145)
Total Bilirubin: 0.3 mg/dL (ref 0.0–1.2)
Total Protein: 6.9 g/dL (ref 6.5–8.1)

## 2024-08-29 LAB — GLUCOSE, CAPILLARY
Glucose-Capillary: 78 mg/dL (ref 70–99)
Glucose-Capillary: 78 mg/dL (ref 70–99)
Glucose-Capillary: 72 mg/dL (ref 70–99)
Glucose-Capillary: 86 mg/dL (ref 70–99)
Glucose-Capillary: 76 mg/dL (ref 70–99)
Glucose-Capillary: 69 mg/dL — ABNORMAL LOW (ref 70–99)

## 2024-08-29 LAB — MAGNESIUM: Magnesium: 2.6 mg/dL — ABNORMAL HIGH (ref 1.7–2.4)

## 2024-08-29 LAB — CBC
HCT: 36.7 % (ref 36.0–46.0)
Hemoglobin: 11.7 g/dL — ABNORMAL LOW (ref 12.0–15.0)
MCH: 28.6 pg (ref 26.0–34.0)
MCHC: 31.9 g/dL (ref 30.0–36.0)
MCV: 89.7 fL (ref 80.0–100.0)
Platelets: 600 10*3/uL — ABNORMAL HIGH (ref 150–400)
RBC: 4.09 MIL/uL (ref 3.87–5.11)
RDW: 13.5 % (ref 11.5–15.5)
WBC: 20.2 10*3/uL — ABNORMAL HIGH (ref 4.0–10.5)
nRBC: 0 % (ref 0.0–0.2)

## 2024-08-29 LAB — LACTIC ACID, PLASMA: Lactic Acid, Venous: 0.9 mmol/L (ref 0.5–1.9)

## 2024-08-29 LAB — PHOSPHORUS: Phosphorus: 3.9 mg/dL (ref 2.5–4.6)

## 2024-08-29 LAB — TRIGLYCERIDES: Triglycerides: 151 mg/dL — ABNORMAL HIGH

## 2024-08-29 MED ORDER — CLEVIDIPINE BUTYRATE 0.5 MG/ML IV EMUL
0.0000 mg/h | INTRAVENOUS | Status: DC
  Administered 2024-08-29: 8 mg/h via INTRAVENOUS
  Administered 2024-08-29: 2 mg/h via INTRAVENOUS
  Administered 2024-08-30: 8 mg/h via INTRAVENOUS
  Administered 2024-08-31: 2 mg/h via INTRAVENOUS
  Administered 2024-08-31: 6 mg/h via INTRAVENOUS
  Administered 2024-09-01: 4 mg/h via INTRAVENOUS
  Filled 2024-08-29 (×7): qty 100

## 2024-08-29 MED ORDER — ACETAMINOPHEN 10 MG/ML IV SOLN
650.0000 mg | INTRAVENOUS | Status: DC
  Administered 2024-08-29 – 2024-08-30 (×5): 650 mg via INTRAVENOUS
  Filled 2024-08-29 (×5): qty 100

## 2024-08-29 MED ORDER — DIATRIZOATE MEGLUMINE & SODIUM 66-10 % PO SOLN
40.0000 mL | Freq: Once | ORAL | Status: AC
  Administered 2024-08-29: 40 mL via NASOGASTRIC
  Filled 2024-08-29: qty 60

## 2024-08-29 MED ORDER — POTASSIUM CHLORIDE 20 MEQ PO PACK
40.0000 meq | PACK | Freq: Once | ORAL | Status: AC
  Administered 2024-08-29: 40 meq
  Filled 2024-08-29: qty 2

## 2024-08-29 MED ORDER — DEXTROSE 50 % IV SOLN
12.5000 g | INTRAVENOUS | Status: AC
  Administered 2024-08-29: 12.5 g via INTRAVENOUS

## 2024-08-29 MED ORDER — TRACE MINERALS CU-MN-SE-ZN 300-55-60-3000 MCG/ML IV SOLN
INTRAVENOUS | Status: DC
  Filled 2024-08-29: qty 288

## 2024-08-29 MED ORDER — INSULIN ASPART 100 UNIT/ML IJ SOLN: 0.0000 [IU] | Freq: Four times a day (QID) | INTRAMUSCULAR | Status: DC

## 2024-08-29 MED ORDER — DEXTROSE 50 % IV SOLN
INTRAVENOUS | Status: AC
  Filled 2024-08-29: qty 50

## 2024-08-29 NOTE — Progress Notes (Signed)
 Inpatient Rehab Admissions Coordinator:   Insurance has denied CIR initial request.  They did not indicate why in their message.  We were offered the opportunity to appeal, which we will plan to do next week when her SBO is resolved.  Will f/u on Monday.   Reche Lowers, PT, DPT Admissions Coordinator [phone] 08/29/24 2:02 PM

## 2024-08-29 NOTE — Progress Notes (Signed)
 Physical Therapy Treatment Patient Details Name: Alexis Mcclain MRN: [id number] DOB: 1951/05/24 Today's Date: 08/29/2024   History of Present Illness A 74 yr old female patient with severe sharp chest pain radiating to the mid back with SOB on 1/20. 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  Treating conservatively. 1/26 SBO vs ileus. PMH: emphysema, and tobacco smoking (63 PY) on Albuterol  PRN    PT Comments  Currently, pt is CGA for bed mobility going from supine to sit. Pt is able to perform transfers with minA and intermittent progressing to CGA. Her need for assistance with transfers suggests deficits in lower extremity muscular power and strength. Pt has ambulated 6ft CGA w/ RW but still continues to display limited ambulation distance due to fatigue and low back pain. She remains at high risk for falls. Pt is still limited by activity tolerance, endurance, balance, mobility. At baseline, pt is independent with mobility without an AD. She is still far from her baseline functional status and is highly motivated to participate and improve. Pt would likely benefit from intensive inpatient rehab >3hrs/day to d/c home. Will continue to follow acutely.   If plan is discharge home, recommend the following: A little help with walking and/or transfers;A little help with bathing/dressing/bathroom;Assistance with cooking/housework;Assist for transportation;Help with stairs or ramp for entrance   Can travel by private vehicle        Equipment Recommendations  BSC/3in1;Rolling walker (2 wheels)    Recommendations for Other Services Rehab consult     Precautions / Restrictions Precautions Precautions: Fall Recall of Precautions/Restrictions: Intact Precaution/Restrictions Comments: Monitor O2 Restrictions Weight Bearing Restrictions Per Provider Order: No     Mobility  Bed Mobility Overal bed mobility: Needs Assistance Bed Mobility:  Supine to Sit     Supine to sit: Contact guard, HOB elevated, Used rails     General bed mobility comments: Pt was CGA for supine to sit and able to come to EOB relying on rails minimally to assist with upper trunk.    Transfers Overall transfer level: Needs assistance Equipment used: Rolling walker (2 wheels), None Transfers: Sit to/from Stand, Bed to chair/wheelchair/BSC Sit to Stand: Min assist, Contact guard assist   Step pivot transfers: Min assist, +2 safety/equipment, Contact guard assist       General transfer comment: Initially Min A (+2 present for safety/lines) to [facility] without AD. At end of session, good improvements to CGA to ensure safety with lines.    Ambulation/Gait Ambulation/Gait assistance: Contact guard assist, +2 safety/equipment Gait Distance (Feet): 74 Feet Assistive device: Rolling walker (2 wheels) Gait Pattern/deviations: Decreased step length - right, Decreased step length - left, Trunk flexed, Narrow base of support, Knee flexed in stance - right, Knee flexed in stance - left, Step-through pattern Gait velocity: Reduced Gait velocity interpretation: <1.31 ft/sec, indicative of household ambulator   General Gait Details: Pt was CGA w/ RW needing extensive cues for ambulating the RW with pt requring cues to remain proximal to the RW. Pt tended to keep a trunk flexed position throughtout ambulation. Pt kept bilateral knees in a increased flexion with a narrow base of support and tends to need increased time to advance the walker. Pt was unable to ambulate back to the room and needed to sit in the recliner and be wheeled back to the room.   Stairs             [employer] Technical Officer  Bed    Modified Rankin (Stroke Patients Only)       Balance Overall balance assessment: Needs assistance Sitting-balance support: Feet supported, No upper extremity supported Sitting balance-Leahy Scale: Fair     Standing balance support: Bilateral upper  extremity supported, During functional activity, Reliant on assistive device for balance Standing balance-Leahy Scale: Fair                              Hotel Manager: No apparent difficulties  Cognition Arousal: Alert, Lethargic Behavior During Therapy: WFL for tasks assessed/performed   PT - Cognitive impairments: Awareness                       PT - Cognition Comments: Pt had decreased awareness of posture throughout session needing cues for upright posture. Following commands: Intact      Cueing Cueing Techniques: Verbal cues, Visual cues, Tactile cues  Exercises      General Comments General comments (skin integrity, edema, etc.): SpO2 reading was 80% (pleth unreliable) on 4LO2. PT pulse ox device was reading 92% on 4 L O2 after walking in hallway.      Pertinent Vitals/Pain Pain Assessment Pain Assessment: Faces Faces Pain Scale: Hurts a little bit Pain Location: back Pain Descriptors / Indicators: Discomfort, Tiring Pain Intervention(s): Limited activity within patient's tolerance, Monitored during session    Home Living                          Prior Function            PT Goals (current goals can now be found in the care plan section) Acute Rehab PT Goals Patient Stated Goal: to go home PT Goal Formulation: With patient Time For Goal Achievement: 09/08/24 Potential to Achieve Goals: Good Progress towards PT goals: Progressing toward goals    Frequency    Min 2X/week      PT Plan      Co-evaluation   Reason for Co-Treatment: Complexity of the patient's impairments (multi-system involvement);To address functional/ADL transfers PT goals addressed during session: Mobility/safety with mobility;Proper use of DME;Balance OT goals addressed during session: ADL's and self-care      AM-PAC PT 6 Clicks Mobility   Outcome Measure  Help needed turning from your back to your side while in a  flat bed without using bedrails?: A Little Help needed moving from lying on your back to sitting on the side of a flat bed without using bedrails?: A Little Help needed moving to and from a bed to a chair (including a wheelchair)?: A Little Help needed standing up from a chair using your arms (e.g., wheelchair or bedside chair)?: A Little Help needed to walk in hospital room?: A Little Help needed climbing 3-5 steps with a railing? : Total 6 Click Score: 16    End of Session Equipment Utilized During Treatment: Gait belt;Oxygen Activity Tolerance: Patient tolerated treatment well;Patient limited by fatigue Patient left: with call bell/phone within reach;with family/visitor present;with nursing/sitter in room;Other (comment) (Pt was on BSC at end of session.) Nurse Communication: Mobility status (Communicated with the nurse that pt was on [facility].) PT Visit Diagnosis: Unsteadiness on feet (R26.81);Muscle weakness (generalized) (M62.81);Difficulty in walking, not elsewhere classified (R26.2);Other abnormalities of gait and mobility (R26.89);Pain Pain - Right/Left:  (Back) Pain - part of body:  (Back)     Time: 8686-8655 PT Time Calculation (min) (  ACUTE ONLY): 31 min  Charges:    $Gait Training: 8-22 mins PT General Charges $$ ACUTE PT VISIT: 1 Visit                     Murtis CHRISTELLA Ferries, SPT    Naz Denunzio 08/29/2024, 3:16 PM

## 2024-08-29 NOTE — Progress Notes (Addendum)
 "    Subjective/Chief Complaint: Had bm, not sure about flatus, ng not in   Objective: Vital signs in last 24 hours: Temp:  [97.9 F (36.6 C)-98.3 F (36.8 C)] 98.3 F (36.8 C) (01/29 2328) Pulse Rate:  [57-80] 77 (01/30 0615) Resp:  [9-26] 20 (01/30 0615) BP: (79-132)/(31-98) 115/48 (01/30 0615) SpO2:  [78 %-100 %] 95 % (01/30 0615) Weight:  [64.1 kg] 64.1 kg (01/30 0413) Last BM Date : 08/29/24  Intake/Output from previous day: 01/29 0701 - 01/30 0700 In: 907.8 [I.V.:153.3; IV Piggyback:754.5] Out: 2150 [Urine:350; Emesis/NG output:1800] Intake/Output this shift: No intake/output data recorded.  Ab mod distended nontender  Lab Results:  Recent Labs    08/28/24 0321 08/28/24 1204 08/29/24 0234  WBC 14.0*  --  20.2*  HGB 12.8 12.9 11.7*  HCT 39.6 38.0 36.7  PLT 604*  --  600*   BMET Recent Labs    08/28/24 1836 08/29/24 0234  NA 136 137  K 4.4 3.6  CL 90* 87*  CO2 32 33*  GLUCOSE 91 74  BUN 21 23  CREATININE 0.89 1.01*  CALCIUM 9.4 9.4   PT/INR No results for input(s): LABPROT, INR in the last 72 hours. ABG Recent Labs    08/28/24 1204  PHART 7.426  HCO3 37.0*    Studies/Results: DG Abd Portable 1V-Small Bowel Protocol-Position Verification Result Date: 08/28/2024 EXAM: 1 VIEW XRAY OF THE ABDOMEN 08/28/2024 02:12:00 PM COMPARISON: 08/24/2024 CLINICAL HISTORY: Encounter for imaging study to confirm nasogastric (NG) tube placement. ICD10: Z46.59 Encounter for fitting and adjustment of other gastrointestinal appliance and device. FINDINGS: LINES, TUBES AND DEVICES: Enteric tube in place with distal tip terminating within the expected location of the gastric antrum. Partially visualized right chest port catheter in place with tip in right atrium. Overlying wires and leads noted. BOWEL: Multiple loops of gas-filled dilated small bowel loops, not significantly changed. SOFT TISSUES: No abnormal calcifications. BONES: No acute fracture. PLEURAL SPACES:  Small left pleural effusion. IMPRESSION: 1. Enteric tube tip terminates within the expected location of the gastric antrum. 2. Multiple loops of gas-filled dilated small bowel, not significantly changed. Electronically signed by: Oneil Devonshire MD 08/28/2024 09:27 PM EST RP Workstation: [id number]   US  EKG SITE RITE Result Date: 08/28/2024 If Site Rite image not attached, placement could not be confirmed due to current cardiac rhythm.  CT Angio Chest/Abd/Pel for Dissection W and/or W/WO Result Date: 08/27/2024 CLINICAL DATA:  Follow-up aortic dissection. EXAM: CT ANGIOGRAPHY CHEST, ABDOMEN AND PELVIS TECHNIQUE: Non-contrast CT of the chest was initially obtained. Multidetector CT imaging through the chest, abdomen and pelvis was performed using the standard protocol during bolus administration of intravenous contrast. Multiplanar reconstructed images and MIPs were obtained and reviewed to evaluate the vascular anatomy. RADIATION DOSE REDUCTION: This exam was performed according to the departmental dose-optimization program which includes automated exposure control, adjustment of the mA and/or kV according to patient size and/or use of iterative reconstruction technique. CONTRAST:  75mL OMNIPAQUE  IOHEXOL  350 MG/ML SOLN COMPARISON:  CT dated 08/21/2024. FINDINGS: CTA CHEST FINDINGS Cardiovascular: There is no cardiomegaly or pericardial effusion. Atherosclerotic calcification of the thoracic aorta. Similar appearance of a type B aortic dissection with intramural hematoma extending from the origin of the left subclavian artery down along the left lateral aortic arch and along the anterior descending thoracic aorta. The intramural hematoma measures approximately 8 mm in thickness similar to prior CT. The origins of the great vessels of the aortic arch and the central pulmonary arteries appear  patent. Mediastinum/Nodes: No hilar or mediastinal adenopathy. An enteric tube is noted in the esophagus. There is a small  hiatal hernia. Lungs/Pleura: Small bilateral pleural effusions similar to prior CT. There is partial compressive atelectasis of the lower lobes. Pneumonia is not excluded. There is background of emphysema. No pneumothorax. The central airways are patent. Musculoskeletal: No acute osseous pathology. Review of the MIP images confirms the above findings. CTA ABDOMEN AND PELVIS FINDINGS VASCULAR Aorta: Advanced calcified and noncalcified plaque of the abdominal aorta. The intramural hematoma of the thoracic aorta terminates at or just inferior to the level of the SMA. No dissection of the abdominal aorta. Celiac: There is focal narrowing of the proximal celiac trunk. The celiac artery and its major branches are patent. SMA: The SMA is patent. Renals: The renal arteries are patent. IMA: The IMA is patent. Inflow: Moderate atherosclerotic calcification of the iliac arteries. No aneurysmal dilatation or dissection. Veins: No obvious venous abnormality within the limitations of this arterial phase study. Review of the MIP images confirms the above findings. NON-VASCULAR No intra-abdominal free air or free fluid. Hepatobiliary: Slight irregularity of the liver contour suspicious for changes of cirrhosis. No biliary ductal dilatation. The gallbladder is unremarkable. Pancreas: Unremarkable. No pancreatic ductal dilatation or surrounding inflammatory changes. Spleen: Normal in size without focal abnormality. Adrenals/Urinary Tract: Bilateral adrenal nodules as seen on the prior CT. No hydronephrosis on either side. The visualized ureters and urinary bladder appear unremarkable. Stomach/Bowel: Enteric tube with tip in the region of the duodenal bulb. There is distal colonic diverticulosis. Diffusely dilated and fluid-filled loops of small bowel measure up to 4 cm in caliber. A transition is noted in the pelvis (227/6). Scattered pockets of air along the periphery of the small bowel lumen may be intraluminal air or developing  pneumatosis. The appendix is normal. Lymphatic: No adenopathy. Reproductive: Hysterectomy.  No suspicious adnexal masses. Other: None Musculoskeletal: Osteopenia with degenerative changes of the spine. No acute osseous pathology. Review of the MIP images confirms the above findings. IMPRESSION: 1. Similar appearance of type B aortic dissection with intramural hematoma extending from the origin of the left subclavian artery down along the left lateral aortic arch and along the anterior descending thoracic aorta. 2. Small bilateral pleural effusions with partial compressive atelectasis of the lower lobes. Pneumonia is not excluded. 3. Small bowel obstruction with transition in the pelvis. Scattered pockets of air along the periphery of the small bowel lumen may be intraluminal air or developing pneumatosis. 4. Colonic diverticulosis. 5.  Aortic Atherosclerosis (ICD10-I70.0). Electronically Signed   By: Vanetta Chou M.D.   On: 08/27/2024 16:06    Anti-infectives: Anti-infectives (From admission, onward)    Start     Dose/Rate Route Frequency Ordered Stop   08/24/24 2100  piperacillin -tazobactam (ZOSYN ) IVPB 3.375 g  Status:  Discontinued        3.375 g 12.5 mL/hr over 240 Minutes Intravenous Every 8 hours 08/24/24 2010 08/27/24 0924   08/24/24 2100  vancomycin  (VANCOREADY) IVPB 1250 mg/250 mL  Status:  Discontinued        1,250 mg 166.7 mL/hr over 90 Minutes Intravenous Every 24 hours 08/24/24 2010 08/27/24 9075       Assessment/Plan: SBO  -discussed with ICU team -remove cortrak -will check plain film first then decide on ng as she did have bm but still clinically is somewhat distended -if ng will do protocol     FEN - NPO/NGT/IVFs per primary VTE - SCDs ID - none currently needed  Type B aortic dissection Acute on chronic respiratory failure Tobacco abuse H/O bladder cancer  I reviewed last 24 h vitals and pain scores, last 48 h intake and output, last 24 h labs and trends, and  last 24 h imaging results.   Donnice Bury 08/29/2024   Xray with not a lot of sb dilation, will do oral gg and check xray "

## 2024-08-29 NOTE — Progress Notes (Signed)
 PHARMACY - TOTAL PARENTERAL NUTRITION CONSULT NOTE   Indication: Small bowel obstruction  Patient Measurements: Height: 5' 4 (162.6 cm) Weight: 64.1 kg (141 lb 5 oz) IBW/kg (Calculated) : 54.7 TPN AdjBW (KG): 58.1 Body mass index is 24.26 kg/m. Usual Weight: appears around 67kg PTA   Assessment:  Patient admitted on 1/20 with CC of chest pain found to have Type B aortic dissection. Throughout hospital stay thought to have an ileus. CT abdomen overnight found to have an small bowel obstruction.Surgery consulted, planning conservative management of SBO, placing NG tube 1/29.   Patient has had minimal PO intake throughout hospital stay thus far.  Pharmacy consulted to dose TPN.   1/30: NGT fell out, has cortrak. Patient reports feeling better. Noted potassium replacement overnight.    Glucose / Insulin : A1C 5.8%, no hx of DM  Electrolytes: Na: 137, K: 3.6 (supplemented), HCO3: 33, Cl: 87, phos: 3.9, mg 2.6, ical: 1.19  Renal: Scr 1.01 (up from 0.63 on 1/22) Hepatic: LFTS WNL 1/30 Intake / Output; MIVF:  lasix  40mg  BID discontinued (last dose 1/29), UOP 350 mL charted with 1x urine occurrence Net IO Since Admission: 2,119.08 mL [08/29/24 0702] LBM 1/29 NGT / emesis output = 1800 mL in last 24 hr  GI Imaging: 1/28 CT chest abdomen pelvis: stable Type B dissection, SBO with transition in pelvis, pockets of air in the small bowel lumen  1/29 Ab Xray - Multiple loops of gas-filled dilated small bowel, not significantly changed  GI Surgeries / Procedures:   Central access: PICC line  TPN start date: 1/30   Nutritional Goals: Goal TPN rate is 55 mL/hr (provides 79 g of protein and 1700 kcals per day)  RD Assessment: Estimated Needs Total Energy Estimated Needs: 1600-1800 kcals Total Protein Estimated Needs: 75-95 g Total Fluid Estimated Needs: >/= 1.6 L  Current Nutrition:  NPO  Plan:  Start TPN at 30 mL/hr at 1800 Electrolytes in TPN: Na 1mEq/L, K 8mEq/L, Ca 31mEq/L, Mg  99mEq/L, and Phos 15mmol/L. Max Chloride  Add standard MVI and trace elements to TPN Initiate Sensitive q6h SSI and adjust as needed  Monitor TPN labs on Mon/Thurs, daily until stable (through weekend)   Rankin Sams, PharmD, BCCCP Clinical Pharmacist

## 2024-08-29 NOTE — Progress Notes (Signed)
 Occupational Therapy Treatment Patient Details Name: Alexis Mcclain MRN: [id number] DOB: 1951-07-23 Today's Date: 08/29/2024   History of present illness A 74 yr old female patient with severe sharp chest pain radiating to the mid back with SOB on 1/20. 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  Treating conservatively. 1/26 SBO vs ileus. PMH: emphysema, and tobacco smoking (63 PY) on Albuterol  PRN   OT comments  Pt making good progress towards OT goals. Pt endorses fatigue but agreeable for therapy session. Pt able to progress BSC transfers with Min-CGA x 2 for safety/lines. Pt requiring increased assist for LB ADLs, including peri care today but able to progress UB ADLs standing at sink with CGA. After standing at sink > 2 minutes, pt able to mobilize out in hallway using RW and +2 for chair follow/lines. SpO2 92% on 4 L O2 though 2/4 DOE noted. Continue to feel pt would make good progress with intensive rehab services.       If plan is discharge home, recommend the following:  A lot of help with walking and/or transfers;A lot of help with bathing/dressing/bathroom   Equipment Recommendations  Other (comment) (TBD)    Recommendations for Other Services Rehab consult    Precautions / Restrictions Precautions Precautions: Fall Recall of Precautions/Restrictions: Intact Precaution/Restrictions Comments: monitor O2 Restrictions Weight Bearing Restrictions Per Provider Order: No       Mobility Bed Mobility Overal bed mobility: Needs Assistance Bed Mobility: Supine to Sit     Supine to sit: Contact guard, HOB elevated, Used rails          Transfers Overall transfer level: Needs assistance Equipment used: Rolling walker (2 wheels) Transfers: Sit to/from Stand, Bed to chair/wheelchair/BSC Sit to Stand: Min assist, Contact guard assist     Step pivot transfers: Min assist, +2 safety/equipment, Contact guard assist      General transfer comment: Initially Min A (+2 present for safety/lines) to [facility] without AD. At end of session, good improvements to CGA to ensure bottom to surface and assist for lines     Balance Overall balance assessment: Needs assistance Sitting-balance support: Feet supported, No upper extremity supported Sitting balance-Leahy Scale: Fair     Standing balance support: Bilateral upper extremity supported, During functional activity, Reliant on assistive device for balance Standing balance-Leahy Scale: Fair Standing balance comment: able to stand at sink without UE support                           ADL either performed or assessed with clinical judgement   ADL Overall ADL's : Needs assistance/impaired     Grooming: Contact guard assist;Standing;Oral care;Wash/dry face Grooming Details (indicate cue type and reason): standing at sink, fair balance without UE support managing bimanual tasks. cues for posture                 Toilet Transfer: Minimal assistance;+2 for safety/equipment;BSC/3in1 Toilet Transfer Details (indicate cue type and reason): no AD to BSC x 2 during session (beginning and end) Toileting- Clothing Manipulation and Hygiene: Moderate assistance;Sit to/from stand Toileting - Clothing Manipulation Details (indicate cue type and reason): assist for hygiene in standing     Functional mobility during ADLs: Contact guard assist;Minimal assistance;+2 for safety/equipment;Rolling walker (2 wheels)      Extremity/Trunk Assessment Upper Extremity Assessment Upper Extremity Assessment: Generalized weakness;Right hand dominant   Lower Extremity Assessment Lower Extremity Assessment: Defer to PT evaluation  Vision   Vision Assessment?: Wears glasses for reading   Perception     Praxis     Communication Communication Communication: No apparent difficulties   Cognition Arousal: Alert Behavior During Therapy: WFL for tasks  assessed/performed Cognition: Cognition impaired         Attention impairment (select first level of impairment): Selective attention Executive functioning impairment (select all impairments): Sequencing OT - Cognition Comments: improving overall, responding to humor. minor cues for sequencing and attention to posture but overall functional                 Following commands: Intact        Cueing   Cueing Techniques: Verbal cues, Visual cues, Tactile cues  Exercises      Shoulder Instructions       General Comments SpO2 appeared WFL on 4 L O2. switched O2 probes to ensure accuracy. personal pulse ox reading 92% on 4 L O2 after walking in hallway    Pertinent Vitals/ Pain       Pain Assessment Pain Assessment: No/denies pain  Home Living                                          Prior Functioning/Environment              Frequency  Min 2X/week        Progress Toward Goals  OT Goals(current goals can now be found in the care plan section)  Progress towards OT goals: Progressing toward goals  Acute Rehab OT Goals Patient Stated Goal: get all of these stuff out of me OT Goal Formulation: With patient Time For Goal Achievement: 09/08/24 Potential to Achieve Goals: Good ADL Goals Pt Will Perform Grooming: with contact guard assist;standing Pt Will Perform Lower Body Dressing: with contact guard assist;sit to/from stand Pt Will Transfer to Toilet: with contact guard assist;ambulating  Plan      Co-evaluation    PT/OT/SLP Co-Evaluation/Treatment: Yes Reason for Co-Treatment: For patient/therapist safety;To address functional/ADL transfers;Other (comment) (progress mobility and ADL tolerance) PT goals addressed during session: Mobility/safety with mobility OT goals addressed during session: ADL's and self-care      AM-PAC OT 6 Clicks Daily Activity     Outcome Measure   Help from another person eating meals?: A Little Help  from another person taking care of personal grooming?: A Little Help from another person toileting, which includes using toliet, bedpan, or urinal?: A Lot Help from another person bathing (including washing, rinsing, drying)?: A Lot Help from another person to put on and taking off regular upper body clothing?: A Little Help from another person to put on and taking off regular lower body clothing?: A Lot 6 Click Score: 15    End of Session Equipment Utilized During Treatment: Rolling walker (2 wheels);Gait belt;Oxygen  OT Visit Diagnosis: Unsteadiness on feet (R26.81);Muscle weakness (generalized) (M62.81);Other symptoms and signs involving cognitive function   Activity Tolerance Patient tolerated treatment well   Patient Left with call bell/phone within reach;Other (comment);with family/visitor present (on [facility])   Nurse Communication Mobility status        Time: 8687-8656 OT Time Calculation (min): 31 min  Charges: OT General Charges $OT Visit: 1 Visit OT Treatments $Self Care/Home Management : 8-22 mins  Mliss NOVAK, OTR/L Acute Rehab Services Office: [phone]   Mliss Fish 08/29/2024, 2:22 PM

## 2024-08-29 NOTE — Progress Notes (Addendum)
 Morning labs reviewed. Worsening leukocytosis, afebrile. Low threshold to start zosyn  if febrile or clinically worsens given SBO. Repleted K. Has new AKI and CO2 33. Will hold Lasix  for now. Repeat lactate. NGT fell out overnight, Cortrak placed back to suction and draining. Abdominal exam is reassuring she is mildly distended but non-tender. Tells me she overall is feeling much better.  Alexis LOISE Blush, PA-C 08/29/24 3:54 AM [facility]  For contact information, see Amion.  After hours, 7PM- 7AM, please call on call APP for 2H.

## 2024-08-29 NOTE — Progress Notes (Signed)
 "  NAME:  Alexis Mcclain, MRN:  [id number], DOB:  1951-07-19, LOS: 10 ADMISSION DATE:  08/19/2024, CONSULTATION DATE:  08/19/2024 REFERRING MD:  Theadore, MD, CHIEF COMPLAINT:  chest pain 2:45 am   History of Present Illness:  A 74 yr old female patient with emphysema, and tobacco smoking (63 PY) on Albuterol  PRN, who awoke up at 2:45 am with severe sharp chest pain radiating to the mid back with SOB. She has chronic smoker cough and mild LL edema (stopped lasix  one year ago). No f/c/r, N/V, abd pain, syncope, wheezing, or hemoptysis. No alcohol or illicit drug use. No anti-PLT or AC meds. EMS gave her: 2 nito and 4 baby asa. Initial BP 212/108, then dropped to 134/82.  Pertinent  Medical History  COPD (emphysema), bladder Ca with tumor removal and chemoRx 2019.    Significant Hospital Events: Including procedures, antibiotic start and stop dates in addition to other pertinent events   1/20 admitted. ECHO LVEF 60-65%, RV fxn nml. Gd I diastolic dysfxn 1/21 still on esmolol  gtt. Still having some pain.  1/22 severe CP overnight, stat CT with stable dissection 1/23 agitated and received Haldol , started precedex   1/24 off precedex  1/26 Ileus>SBO via KUB, cortrak to LIS, starting to have evidence of BM 1/28 still had abd pain. CT w/ obstruction, possible pneumotosis w/ transition point 1/29 surgical team consulted to eval abd  1/30  Started to have Bms and coretrack pulled per patient's request.  Interim History / Subjective:  Patient states that she had several BMs last night confirmed by RN and she had 2 this morning.  Abdominal still distended but less tense according to the patient.  She denies any severe abdominal pain.  She does not want the NG or the core track.  Requests to removed as soon as possible.  Is keen to eat and drink but understands she has to hold for now.  Urine output has been low overnight and also white cell count is up.  No fevers.  Objective    Blood pressure 116/79,  pulse 86, temperature 97.8 F (36.6 C), temperature source Oral, resp. rate (!) 23, height 5' 4 (1.626 m), weight 64.1 kg, SpO2 92%.        Intake/Output Summary (Last 24 hours) at 08/29/2024 1201 Last data filed at 08/29/2024 1100 Gross per 24 hour  Intake 986.69 ml  Output 1800 ml  Net -813.31 ml   Filed Weights   08/27/24 0354 08/28/24 0435 08/29/24 0413  Weight: 61 kg 63.1 kg 64.1 kg   Physical exam: General: Alert oriented. HEENT: [place]/AT, eyes anicteric.  moist mucus membranes Neuro: Alert, awake following commands Chest: Coarse breath sounds, no wheezes or rhonchi Heart: Regular rate and rhythm, no murmurs or gallops Abdomen: Soft, nontender, distended, bowel sounds present, sluggish.   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  Patient was tolerating beta-blockers well however because she is n.p.o. she is on Cardene  drip right now.  Once she is able to take p.o. we will restart oral meds and titrate Cardene  down. May need to change back to IV depending on surgical team recs F/u  CT 6 weeks Cont SCD for DVT prophylaxis.   SBO w/ transition point -improving Nausea and vomiting has resolved she had several BMs overnight Plan Patient removed NG overnight take core track  out for now Appreciate general surgery input Low-dose Gastrografin  for now Take out core track as per the request of the patient N.p.o. with ice chips for now if she is doing okay we can consider clears tonight Abdominal x-ray today did not show any significant bowel obstruction  Mobilize K > 4. Mg > 2  Acute on chronic respiratory failure with hypoxia Emphysema w/ on-going tobacco abuse;  1.5ppd smoker  SOB/hypoxemia- some improvement with diuresis, but feel this is primarily atelectasis  and dec'd abd compliance.  CT small bilateral effusions. R basilar atx  Currently on 4 L nasal cannula Plan Cont to mobilize Cont BDs (brovana  and yupelri  w/ PRN duoneb) Bronchopulmonary hygiene - flutter valve Hold further diuresis today Monitor urine output if low we will give her another dose of Lasix  Cont pulse ox  Wean o2  Fluid and electrolyte status. Total volume overload, hypokalemia, hypomagnesemia Plan K replaced Hold Lasix  for today reevaluate the need tomorrow. Trend lytes   Pulmonary nodules Plan CT scan around June 2026 Appointment made   Tamela Stakes, MD  Attending Physician, Critical Care Medicine [facility] See Amion for pager If no response to pager, please call [phone] until 7pm After 7pm, Please call E-link [phone]        "

## 2024-08-30 DIAGNOSIS — J9621 Acute and chronic respiratory failure with hypoxia: Secondary | ICD-10-CM | POA: Diagnosis not present

## 2024-08-30 DIAGNOSIS — K56609 Unspecified intestinal obstruction, unspecified as to partial versus complete obstruction: Secondary | ICD-10-CM | POA: Diagnosis not present

## 2024-08-30 DIAGNOSIS — Z720 Tobacco use: Secondary | ICD-10-CM | POA: Diagnosis not present

## 2024-08-30 DIAGNOSIS — I7100 Dissection of unspecified site of aorta: Secondary | ICD-10-CM | POA: Diagnosis not present

## 2024-08-30 LAB — BASIC METABOLIC PANEL WITH GFR
Anion gap: 8 (ref 5–15)
BUN: 16 mg/dL (ref 8–23)
CO2: 33 mmol/L — ABNORMAL HIGH (ref 22–32)
Calcium: 9 mg/dL (ref 8.9–10.3)
Chloride: 98 mmol/L (ref 98–111)
Creatinine, Ser: 0.69 mg/dL (ref 0.44–1.00)
GFR, Estimated: >60 mL/min
Glucose, Bld: 100 mg/dL — ABNORMAL HIGH (ref 70–99)
Potassium: 4.8 mmol/L (ref 3.5–5.1)
Sodium: 139 mmol/L (ref 135–145)

## 2024-08-30 LAB — RENAL FUNCTION PANEL
Albumin: 3.2 g/dL — ABNORMAL LOW (ref 3.5–5.0)
Anion gap: 17 — ABNORMAL HIGH (ref 5–15)
BUN: 22 mg/dL (ref 8–23)
CO2: 30 mmol/L (ref 22–32)
Calcium: 8.9 mg/dL (ref 8.9–10.3)
Chloride: 96 mmol/L — ABNORMAL LOW (ref 98–111)
Creatinine, Ser: 0.74 mg/dL (ref 0.44–1.00)
GFR, Estimated: >60 mL/min
Glucose, Bld: 74 mg/dL (ref 70–99)
Phosphorus: 1.8 mg/dL — ABNORMAL LOW (ref 2.5–4.6)
Potassium: 3.4 mmol/L — ABNORMAL LOW (ref 3.5–5.1)
Sodium: 143 mmol/L (ref 135–145)

## 2024-08-30 LAB — GLUCOSE, CAPILLARY
Glucose-Capillary: 131 mg/dL — ABNORMAL HIGH (ref 70–99)
Glucose-Capillary: 66 mg/dL — ABNORMAL LOW (ref 70–99)
Glucose-Capillary: 117 mg/dL — ABNORMAL HIGH (ref 70–99)
Glucose-Capillary: 71 mg/dL (ref 70–99)
Glucose-Capillary: 104 mg/dL — ABNORMAL HIGH (ref 70–99)
Glucose-Capillary: 91 mg/dL (ref 70–99)
Glucose-Capillary: 106 mg/dL — ABNORMAL HIGH (ref 70–99)

## 2024-08-30 LAB — CBC
HCT: 36.6 % (ref 36.0–46.0)
Hemoglobin: 11.3 g/dL — ABNORMAL LOW (ref 12.0–15.0)
MCH: 28.2 pg (ref 26.0–34.0)
MCHC: 30.9 g/dL (ref 30.0–36.0)
MCV: 91.3 fL (ref 80.0–100.0)
Platelets: 591 10*3/uL — ABNORMAL HIGH (ref 150–400)
RBC: 4.01 MIL/uL (ref 3.87–5.11)
RDW: 13.8 % (ref 11.5–15.5)
WBC: 17.2 10*3/uL — ABNORMAL HIGH (ref 4.0–10.5)
nRBC: 0 % (ref 0.0–0.2)

## 2024-08-30 LAB — PHOSPHORUS: Phosphorus: 3.2 mg/dL (ref 2.5–4.6)

## 2024-08-30 LAB — MAGNESIUM
Magnesium: 2.1 mg/dL (ref 1.7–2.4)
Magnesium: 1.8 mg/dL (ref 1.7–2.4)

## 2024-08-30 MED ORDER — POTASSIUM CHLORIDE 10 MEQ/50ML IV SOLN
10.0000 meq | INTRAVENOUS | Status: DC
  Administered 2024-08-30 (×3): 10 meq via INTRAVENOUS
  Filled 2024-08-30 (×3): qty 50

## 2024-08-30 MED ORDER — POTASSIUM PHOSPHATES 15 MMOLE/5ML IV SOLN
30.0000 mmol | Freq: Once | INTRAVENOUS | Status: AC
  Administered 2024-08-30: 30 mmol via INTRAVENOUS
  Filled 2024-08-30: qty 10

## 2024-08-30 MED ORDER — ACETAMINOPHEN 325 MG PO TABS
650.0000 mg | ORAL_TABLET | Freq: Four times a day (QID) | ORAL | Status: DC | PRN
  Administered 2024-08-30 – 2024-09-01 (×4): 650 mg via ORAL
  Filled 2024-08-30 (×4): qty 2

## 2024-08-30 MED ORDER — POTASSIUM CHLORIDE 10 MEQ/50ML IV SOLN
10.0000 meq | INTRAVENOUS | Status: AC
  Administered 2024-08-30 (×4): 10 meq via INTRAVENOUS
  Filled 2024-08-30 (×4): qty 50

## 2024-08-30 MED ORDER — DEXTROSE 50 % IV SOLN
12.5000 g | INTRAVENOUS | Status: AC
  Administered 2024-08-30: 12.5 g via INTRAVENOUS

## 2024-08-30 MED ORDER — MAGNESIUM SULFATE 2 GM/50ML IV SOLN
2.0000 g | Freq: Once | INTRAVENOUS | Status: AC
  Administered 2024-08-30: 2 g via INTRAVENOUS
  Filled 2024-08-30: qty 50

## 2024-08-30 MED ORDER — DEXTROSE 50 % IV SOLN: 12.5000 g | INTRAVENOUS | Status: AC

## 2024-08-30 MED ORDER — METOPROLOL TARTRATE 50 MG PO TABS
50.0000 mg | ORAL_TABLET | Freq: Two times a day (BID) | ORAL | Status: DC
  Administered 2024-08-30 – 2024-09-01 (×5): 50 mg via ORAL
  Filled 2024-08-30 (×5): qty 1

## 2024-08-30 NOTE — Progress Notes (Signed)
 "    Subjective/Chief Complaint: Having bm, no nausea or vomiting.  Hungry.     Objective: Vital signs in last 24 hours: Temp:  [97.3 F (36.3 C)-98.2 F (36.8 C)] 97.3 F (36.3 C) (01/31 0813) Pulse Rate:  [72-97] 89 (01/31 0715) Resp:  [13-32] 28 (01/31 0715) BP: (69-141)/(36-108) 121/49 (01/31 0715) SpO2:  [85 %-99 %] 96 % (01/31 0715) Weight:  [61.1 kg] 61.1 kg (01/31 0445) Last BM Date : 08/29/24  Intake/Output from previous day: 01/30 0701 - 01/31 0700 In: 1269.4 [I.V.:780.6; IV Piggyback:488.9] Out: 100 [Emesis/NG output:100] Intake/Output this shift: No intake/output data recorded.  Abd: soft, NT, ND  Lab Results:  Recent Labs    08/29/24 0234 08/30/24 0201  WBC 20.2* 17.2*  HGB 11.7* 11.3*  HCT 36.7 36.6  PLT 600* 591*   BMET Recent Labs    08/29/24 0234 08/30/24 0201  NA 137 143  K 3.6 3.4*  CL 87* 96*  CO2 33* 30  GLUCOSE 74 74  BUN 23 22  CREATININE 1.01* 0.74  CALCIUM 9.4 8.9   PT/INR No results for input(s): LABPROT, INR in the last 72 hours. ABG Recent Labs    08/28/24 1204  PHART 7.426  HCO3 37.0*    Studies/Results: DG Abd Portable 1V Result Date: 08/29/2024 CLINICAL DATA:  Small bowel obstruction.  NG tube adjustment. EXAM: PORTABLE ABDOMEN - 1 VIEW COMPARISON:  Radiograph yesterday FINDINGS: Tip of the weighted enteric tube in the right upper quadrant in the region of the proximal duodenum. Enteric contrast is seen in the ascending and transverse colon. No definite gaseous small bowel distention on the current exam. IMPRESSION: 1. Tip of the weighted enteric tube in the right upper quadrant in the region of the proximal duodenum. 2. Enteric contrast in the ascending and transverse colon. No definite gaseous small bowel distention on the current exam. Electronically Signed   By: Andrea Gasman M.D.   On: 08/29/2024 16:02   DG Abd Portable 1V-Small Bowel Protocol-Position Verification Result Date: 08/28/2024 EXAM: 1 VIEW XRAY OF  THE ABDOMEN 08/28/2024 02:12:00 PM COMPARISON: 08/24/2024 CLINICAL HISTORY: Encounter for imaging study to confirm nasogastric (NG) tube placement. ICD10: Z46.59 Encounter for fitting and adjustment of other gastrointestinal appliance and device. FINDINGS: LINES, TUBES AND DEVICES: Enteric tube in place with distal tip terminating within the expected location of the gastric antrum. Partially visualized right chest port catheter in place with tip in right atrium. Overlying wires and leads noted. BOWEL: Multiple loops of gas-filled dilated small bowel loops, not significantly changed. SOFT TISSUES: No abnormal calcifications. BONES: No acute fracture. PLEURAL SPACES: Small left pleural effusion. IMPRESSION: 1. Enteric tube tip terminates within the expected location of the gastric antrum. 2. Multiple loops of gas-filled dilated small bowel, not significantly changed. Electronically signed by: Oneil Devonshire MD 08/28/2024 09:27 PM EST RP Workstation: [id number]   US  EKG SITE RITE Result Date: 08/28/2024 If Site Rite image not attached, placement could not be confirmed due to current cardiac rhythm.   Anti-infectives: Anti-infectives (From admission, onward)    Start     Dose/Rate Route Frequency Ordered Stop   08/24/24 2100  piperacillin -tazobactam (ZOSYN ) IVPB 3.375 g  Status:  Discontinued        3.375 g 12.5 mL/hr over 240 Minutes Intravenous Every 8 hours 08/24/24 2010 08/27/24 0924   08/24/24 2100  vancomycin  (VANCOREADY) IVPB 1250 mg/250 mL  Status:  Discontinued        1,250 mg 166.7 mL/hr over 90 Minutes Intravenous  Every 24 hours 08/24/24 2010 08/27/24 0924       Assessment/Plan: SBO  -GG in colon, having bowel function with no N/V -adv to CLD for breakfast and lunch.  If tolerates, FLD for dinner -hopefully solid diet in am     FEN - CLD, FLD at supper VTE - SCDs ID - none currently needed   Type B aortic dissection Acute on chronic respiratory failure Tobacco abuse H/O bladder  cancer  I reviewed last 24 h vitals and pain scores, last 48 h intake and output, last 24 h labs and trends, and last 24 h imaging results.   Burnard FORBES Banter 08/30/2024   Xray with not a lot of sb dilation, will do oral gg and check xray "

## 2024-08-31 DIAGNOSIS — Z720 Tobacco use: Secondary | ICD-10-CM | POA: Diagnosis not present

## 2024-08-31 DIAGNOSIS — K56609 Unspecified intestinal obstruction, unspecified as to partial versus complete obstruction: Secondary | ICD-10-CM | POA: Diagnosis not present

## 2024-08-31 DIAGNOSIS — R918 Other nonspecific abnormal finding of lung field: Secondary | ICD-10-CM | POA: Diagnosis not present

## 2024-08-31 DIAGNOSIS — I7100 Dissection of unspecified site of aorta: Secondary | ICD-10-CM | POA: Diagnosis not present

## 2024-08-31 DIAGNOSIS — J9621 Acute and chronic respiratory failure with hypoxia: Secondary | ICD-10-CM | POA: Diagnosis not present

## 2024-08-31 DIAGNOSIS — I71012 Dissection of descending thoracic aorta: Secondary | ICD-10-CM | POA: Diagnosis not present

## 2024-08-31 LAB — RENAL FUNCTION PANEL
Albumin: 3.1 g/dL — ABNORMAL LOW (ref 3.5–5.0)
Anion gap: 11 (ref 5–15)
BUN: 16 mg/dL (ref 8–23)
CO2: 29 mmol/L (ref 22–32)
Calcium: 8.6 mg/dL — ABNORMAL LOW (ref 8.9–10.3)
Chloride: 99 mmol/L (ref 98–111)
Creatinine, Ser: 0.63 mg/dL (ref 0.44–1.00)
GFR, Estimated: >60 mL/min
Glucose, Bld: 95 mg/dL (ref 70–99)
Phosphorus: 2.7 mg/dL (ref 2.5–4.6)
Potassium: 4.3 mmol/L (ref 3.5–5.1)
Sodium: 139 mmol/L (ref 135–145)

## 2024-08-31 LAB — CBC
HCT: 36.8 % (ref 36.0–46.0)
Hemoglobin: 11.5 g/dL — ABNORMAL LOW (ref 12.0–15.0)
MCH: 28.3 pg (ref 26.0–34.0)
MCHC: 31.3 g/dL (ref 30.0–36.0)
MCV: 90.4 fL (ref 80.0–100.0)
Platelets: 579 10*3/uL — ABNORMAL HIGH (ref 150–400)
RBC: 4.07 MIL/uL (ref 3.87–5.11)
RDW: 14 % (ref 11.5–15.5)
WBC: 17.5 10*3/uL — ABNORMAL HIGH (ref 4.0–10.5)
nRBC: 0 % (ref 0.0–0.2)

## 2024-08-31 LAB — GLUCOSE, CAPILLARY
Glucose-Capillary: 95 mg/dL (ref 70–99)
Glucose-Capillary: 95 mg/dL (ref 70–99)
Glucose-Capillary: 81 mg/dL (ref 70–99)
Glucose-Capillary: 114 mg/dL — ABNORMAL HIGH (ref 70–99)
Glucose-Capillary: 92 mg/dL (ref 70–99)
Glucose-Capillary: 90 mg/dL (ref 70–99)

## 2024-08-31 LAB — MAGNESIUM: Magnesium: 2.1 mg/dL (ref 1.7–2.4)

## 2024-08-31 MED ORDER — PHENOL 1.4 % MT LIQD
1.0000 | OROMUCOSAL | Status: DC | PRN
  Filled 2024-08-31: qty 177

## 2024-08-31 MED ORDER — LOSARTAN POTASSIUM 25 MG PO TABS
25.0000 mg | ORAL_TABLET | Freq: Every day | ORAL | Status: DC
  Administered 2024-08-31: 25 mg via ORAL
  Filled 2024-08-31: qty 1

## 2024-08-31 MED ORDER — ENSURE PLUS HIGH PROTEIN PO LIQD
237.0000 mL | Freq: Two times a day (BID) | ORAL | Status: DC
  Administered 2024-08-31: 237 mL via ORAL

## 2024-08-31 NOTE — Progress Notes (Signed)
 "  NAME:  Alexis Mcclain, MRN:  [id number], DOB:  1950-08-05, LOS: 12 ADMISSION DATE:  08/19/2024, CONSULTATION DATE:  08/19/2024 REFERRING MD:  Theadore, MD, CHIEF COMPLAINT:  chest pain 2:45 am   History of Present Illness:  A 74 yr old female patient with emphysema, and tobacco smoking (63 PY) on Albuterol  PRN, who awoke up at 2:45 am with severe sharp chest pain radiating to the mid back with SOB. She has chronic smoker cough and mild LL edema (stopped lasix  one year ago). No f/c/r, N/V, abd pain, syncope, wheezing, or hemoptysis. No alcohol or illicit drug use. No anti-PLT or AC meds. EMS gave her: 2 nito and 4 baby asa. Initial BP 212/108, then dropped to 134/82.  Pertinent  Medical History  COPD (emphysema), bladder Ca with tumor removal and chemoRx 2019.    Significant Hospital Events: Including procedures, antibiotic start and stop dates in addition to other pertinent events   1/20 admitted. ECHO LVEF 60-65%, RV fxn nml. Gd I diastolic dysfxn 1/21 still on esmolol  gtt. Still having some pain.  1/22 severe CP overnight, stat CT with stable dissection 1/23 agitated and received Haldol , started precedex   1/24 off precedex  1/26 Ileus>SBO via KUB, cortrak to LIS, starting to have evidence of BM 1/28 still had abd pain. CT w/ obstruction, possible pneumotosis w/ transition point 1/29 surgical team consulted to eval abd  1/30  Started to have Bms and coretrack pulled per patient's request. 1/31 SBO resolved.  Interim History / Subjective:  Patient has been on and off Cleviprex .  She is able to eat and drink and feels much better.  She is resting comfortably.   Objective    Blood pressure 119/61, pulse 69, temperature (!) 96.7 F (35.9 C), temperature source Oral, resp. rate (!) 24, height 5' 4 (1.626 m), weight 63.5 kg, SpO2 90%.        Intake/Output Summary (Last 24 hours) at 08/31/2024 1128 Last data filed at 08/31/2024 1100 Gross per 24 hour  Intake 582.95 ml  Output --  Net  582.95 ml   Filed Weights   08/29/24 0413 08/30/24 0445 08/31/24 0500  Weight: 64.1 kg 61.1 kg 63.5 kg   Physical exam: General: Alert and oriented. No distress. HEENT: /AT, eyes anicteric.  moist mucus membranes Neuro: Alert, awake following commands Chest: Coarse breath sounds, no wheezes or rhonchi Heart: Regular rate and rhythm, no murmurs or gallops Abdomen: Soft, nontender, nondistended, bowel sounds present    Resolved problem list   Acute encephalopathy due to ICU Delirium Assessment and Plan    Acute type B thoracic aortic intramural hematoma -clinically and radiographically stabilized -vascular has signed off Plan Cont antihypertensives and diuretics.  Continue Lopressor  and add losartan .  And continue to wean down claviprex SBP goal < 120 F/u  CT 6 weeks Cont SCD for DVT prophylaxis.   SBO w/ transition point  - resolved Nausea and vomiting has resolved she had several BMs overnight Plan  Patient is on soft diet.  Mobilize K > 4. Mg > 2  Acute on chronic respiratory failure with hypoxia Emphysema w/ on-going tobacco abuse;  1.5ppd smoker  SOB/hypoxemia- some improvement with diuresis, but feel this is primarily atelectasis and dec'd abd compliance.  CT small bilateral effusions. R basilar atx  Currently on 3 L nasal cannula Plan Cont to mobilize Cont BDs (brovana  and yupelri  w/ PRN duoneb) Bronchopulmonary hygiene - flutter valve Hold diuresis today Cont pulse ox  Wean o2  Fluid and electrolyte  status. Total volume overload, hypokalemia, hypomagnesemia Plan K replaced Phos replaced Hold Lasix  for today reevaluate the need tomorrow. Trend lytes   Pulmonary nodules Plan CT scan around June 2026 Appointment made  Patient to remain in ICU until she is off Cleviprex  completely.  Tamela Stakes, MD  Attending Physician, Critical Care Medicine  [facility] See Amion for pager If no response to pager, please call  [phone] until 7pm After 7pm, Please call E-link [phone]        "

## 2024-08-31 NOTE — Plan of Care (Signed)
   Problem: Education: Goal: Knowledge of General Education information will improve Description: Including pain rating scale, medication(s)/side effects and non-pharmacologic comfort measures Outcome: Progressing   Problem: Health Behavior/Discharge Planning: Goal: Ability to manage health-related needs will improve Outcome: Progressing   Problem: Clinical Measurements: Goal: Will remain free from infection Outcome: Progressing

## 2024-08-31 NOTE — Progress Notes (Signed)
"   ° °  Subjective/Chief Complaint: Having bm, no nausea or vomiting.  Hungry.  Tolerating FLD   Objective: Vital signs in last 24 hours: Temp:  [96.7 F (35.9 C)-98.4 F (36.9 C)] 96.7 F (35.9 C) (02/01 0755) Pulse Rate:  [66-104] 80 (02/01 0845) Resp:  [10-31] 23 (02/01 0845) BP: (90-151)/(51-103) 106/67 (02/01 0845) SpO2:  [78 %-98 %] 89 % (02/01 0845) Weight:  [63.5 kg] 63.5 kg (02/01 0500) Last BM Date : 08/30/24  Intake/Output from previous day: 01/31 0701 - 02/01 0700 In: 951.4 [I.V.:166.2; IV Piggyback:785.2] Out: -  Intake/Output this shift: Total I/O In: 8 [I.V.:8] Out: -   Abd: soft, NT, ND  Lab Results:  Recent Labs    08/30/24 0201 08/31/24 0353  WBC 17.2* 17.5*  HGB 11.3* 11.5*  HCT 36.6 36.8  PLT 591* 579*   BMET Recent Labs    08/30/24 1632 08/31/24 0353  NA 139 139  K 4.8 4.3  CL 98 99  CO2 33* 29  GLUCOSE 100* 95  BUN 16 16  CREATININE 0.69 0.63  CALCIUM 9.0 8.6*   PT/INR No results for input(s): LABPROT, INR in the last 72 hours. ABG Recent Labs    08/28/24 1204  PHART 7.426  HCO3 37.0*    Studies/Results: DG Abd Portable 1V-Small Bowel Obstruction Protocol-initial, 8 hr delay Result Date: 08/30/2024 CLINICAL DATA:  Small bowel obstruction EXAM: PORTABLE ABDOMEN - 1 VIEW COMPARISON:  August 29, 2024 FINDINGS: Enteric contrast delineates multiple loops of small and large bowel. This is favored progresses prior with enteric contrast within the favored sigmoid colon rectum. Overall gaseous dilation of loops of small bowel has decreased since prior. Surgical clips in the pelvis. IMPRESSION: Continued progression of enteric contrast. Electronically Signed   By: Corean Salter M.D.   On: 08/30/2024 12:50    Anti-infectives: Anti-infectives (From admission, onward)    Start     Dose/Rate Route Frequency Ordered Stop   08/24/24 2100  piperacillin -tazobactam (ZOSYN ) IVPB 3.375 g  Status:  Discontinued        3.375 g 12.5 mL/hr  over 240 Minutes Intravenous Every 8 hours 08/24/24 2010 08/27/24 0924   08/24/24 2100  vancomycin  (VANCOREADY) IVPB 1250 mg/250 mL  Status:  Discontinued        1,250 mg 166.7 mL/hr over 90 Minutes Intravenous Every 24 hours 08/24/24 2010 08/27/24 0924       Assessment/Plan: SBO  -seems to have resolved.  Tolerating FLD with no issues -adv to soft diet -we will sign off at this time.  We are available if needed.     FEN - soft VTE - SCDs ID - none currently needed   Type B aortic dissection Acute on chronic respiratory failure Tobacco abuse H/O bladder cancer  I reviewed last 24 h vitals and pain scores, last 48 h intake and output, last 24 h labs and trends, and last 24 h imaging results.   Alexis Mcclain 08/31/2024   Xray with not a lot of sb dilation, will do oral gg and check xray "

## 2024-09-01 ENCOUNTER — Other Ambulatory Visit (HOSPITAL_COMMUNITY): Payer: Self-pay

## 2024-09-01 ENCOUNTER — Encounter: Payer: Self-pay | Admitting: Oncology

## 2024-09-01 ENCOUNTER — Telehealth (HOSPITAL_COMMUNITY): Payer: Self-pay

## 2024-09-01 DIAGNOSIS — J439 Emphysema, unspecified: Secondary | ICD-10-CM | POA: Diagnosis not present

## 2024-09-01 DIAGNOSIS — E878 Other disorders of electrolyte and fluid balance, not elsewhere classified: Secondary | ICD-10-CM

## 2024-09-01 DIAGNOSIS — E876 Hypokalemia: Secondary | ICD-10-CM | POA: Diagnosis not present

## 2024-09-01 DIAGNOSIS — I7100 Dissection of unspecified site of aorta: Secondary | ICD-10-CM | POA: Diagnosis not present

## 2024-09-01 DIAGNOSIS — R918 Other nonspecific abnormal finding of lung field: Secondary | ICD-10-CM | POA: Diagnosis not present

## 2024-09-01 DIAGNOSIS — Z720 Tobacco use: Secondary | ICD-10-CM | POA: Diagnosis not present

## 2024-09-01 DIAGNOSIS — K56609 Unspecified intestinal obstruction, unspecified as to partial versus complete obstruction: Secondary | ICD-10-CM | POA: Diagnosis not present

## 2024-09-01 DIAGNOSIS — J9621 Acute and chronic respiratory failure with hypoxia: Secondary | ICD-10-CM | POA: Diagnosis not present

## 2024-09-01 LAB — RENAL FUNCTION PANEL
Albumin: 3.1 g/dL — ABNORMAL LOW (ref 3.5–5.0)
Anion gap: 12 (ref 5–15)
BUN: 14 mg/dL (ref 8–23)
CO2: 27 mmol/L (ref 22–32)
Calcium: 8.7 mg/dL — ABNORMAL LOW (ref 8.9–10.3)
Chloride: 97 mmol/L — ABNORMAL LOW (ref 98–111)
Creatinine, Ser: 0.62 mg/dL (ref 0.44–1.00)
GFR, Estimated: >60 mL/min
Glucose, Bld: 86 mg/dL (ref 70–99)
Phosphorus: 2.8 mg/dL (ref 2.5–4.6)
Potassium: 4.3 mmol/L (ref 3.5–5.1)
Sodium: 136 mmol/L (ref 135–145)

## 2024-09-01 LAB — CBC
HCT: 37.6 % (ref 36.0–46.0)
Hemoglobin: 12.1 g/dL (ref 12.0–15.0)
MCH: 28.9 pg (ref 26.0–34.0)
MCHC: 32.2 g/dL (ref 30.0–36.0)
MCV: 89.7 fL (ref 80.0–100.0)
Platelets: 578 10*3/uL — ABNORMAL HIGH (ref 150–400)
RBC: 4.19 MIL/uL (ref 3.87–5.11)
RDW: 14 % (ref 11.5–15.5)
WBC: 16.3 10*3/uL — ABNORMAL HIGH (ref 4.0–10.5)
nRBC: 0 % (ref 0.0–0.2)

## 2024-09-01 LAB — MAGNESIUM: Magnesium: 1.8 mg/dL (ref 1.7–2.4)

## 2024-09-01 LAB — GLUCOSE, CAPILLARY
Glucose-Capillary: 112 mg/dL — ABNORMAL HIGH (ref 70–99)
Glucose-Capillary: 92 mg/dL (ref 70–99)
Glucose-Capillary: 72 mg/dL (ref 70–99)
Glucose-Capillary: 113 mg/dL — ABNORMAL HIGH (ref 70–99)
Glucose-Capillary: 138 mg/dL — ABNORMAL HIGH (ref 70–99)
Glucose-Capillary: 116 mg/dL — ABNORMAL HIGH (ref 70–99)

## 2024-09-01 MED ORDER — QUETIAPINE FUMARATE 25 MG PO TABS
12.5000 mg | ORAL_TABLET | Freq: Every day | ORAL | Status: DC
  Administered 2024-09-01: 12.5 mg via ORAL
  Filled 2024-09-01: qty 1

## 2024-09-01 MED ORDER — ALBUTEROL SULFATE (2.5 MG/3ML) 0.083% IN NEBU: 3.0000 mL | INHALATION_SOLUTION | RESPIRATORY_TRACT | Status: DC | PRN

## 2024-09-01 MED ORDER — METOPROLOL TARTRATE 50 MG PO TABS
100.0000 mg | ORAL_TABLET | Freq: Two times a day (BID) | ORAL | Status: DC
  Administered 2024-09-01 – 2024-09-02 (×2): 100 mg via ORAL
  Filled 2024-09-01 (×2): qty 2

## 2024-09-01 MED ORDER — LOSARTAN POTASSIUM 25 MG PO TABS
50.0000 mg | ORAL_TABLET | Freq: Every day | ORAL | Status: DC
  Administered 2024-09-01 – 2024-09-02 (×2): 50 mg via ORAL
  Filled 2024-09-01 (×2): qty 2

## 2024-09-01 MED ORDER — UMECLIDINIUM-VILANTEROL 62.5-25 MCG/ACT IN AEPB
1.0000 | INHALATION_SPRAY | Freq: Every day | RESPIRATORY_TRACT | Status: DC
  Filled 2024-09-01: qty 14

## 2024-09-01 MED ORDER — METOPROLOL TARTRATE 50 MG PO TABS
50.0000 mg | ORAL_TABLET | Freq: Once | ORAL | Status: AC
  Administered 2024-09-01: 50 mg via ORAL
  Filled 2024-09-01: qty 1

## 2024-09-01 MED ORDER — BOOST / RESOURCE BREEZE PO LIQD CUSTOM
1.0000 | Freq: Three times a day (TID) | ORAL | Status: DC
  Administered 2024-09-01 – 2024-09-02 (×2): 1 via ORAL

## 2024-09-01 MED ORDER — LABETALOL HCL 5 MG/ML IV SOLN
10.0000 mg | INTRAVENOUS | Status: DC | PRN
  Administered 2024-09-01: 10 mg via INTRAVENOUS
  Filled 2024-09-01: qty 4

## 2024-09-01 MED ORDER — HYDRALAZINE HCL 20 MG/ML IJ SOLN
10.0000 mg | Freq: Four times a day (QID) | INTRAMUSCULAR | Status: DC | PRN
  Administered 2024-09-01 (×3): 10 mg via INTRAVENOUS
  Filled 2024-09-01 (×2): qty 1

## 2024-09-01 MED ORDER — LOSARTAN POTASSIUM 25 MG PO TABS: 50.0000 mg | ORAL_TABLET | Freq: Every day | ORAL | Status: DC

## 2024-09-01 NOTE — Progress Notes (Signed)
 Physical Therapy Treatment Patient Details Name: Alexis Mcclain MRN: [id number] DOB: 1950/11/20 Today's Date: 09/01/2024   History of Present Illness Pt is a 74 y.o. female presenting 1/20 with chest pain radiating to back, SOB. Found to have acute type B aortic dissection. Placed on esmolol . ECHO with LVEF 60-65%. Course complicated by delirium 1/23, SBO 1/26. PMH: COPD, bladder CA, emphysema, and tobacco use    PT Comments  The pt presents with good progress this afternoon in both transfer ability and ambulation endurance. Pt able to complete sit-stand transfers with CGA to supervision, but continues to benefit from at least single UE support for balance. Pt initially maintaining SpO2 >90% on RA, but after 100 ft pt with increased work of breathing and SpO2 to 87% with good pleth, required seated rest and 1L O2 to recover. Pt then able to complete additional 200 ft ambulation with CGA and 1L O2 with VSS. Pt reports good support at home and hopeful to d/c home with family support. At this time, pt making good progress and could d/c home with family support and HHPT as well as DME listed below. Recommendations updated based on pt progress.     If plan is discharge home, recommend the following: A little help with walking and/or transfers;A little help with bathing/dressing/bathroom;Assistance with cooking/housework;Assist for transportation;Help with stairs or ramp for entrance   Can travel by private vehicle        Equipment Recommendations  Rollator (4 wheels);BSC/3in1    Recommendations for Other Services       Precautions / Restrictions Precautions Precautions: Fall Recall of Precautions/Restrictions: Intact Precaution/Restrictions Comments: Monitor O2 Restrictions Weight Bearing Restrictions Per Provider Order: No     Mobility  Bed Mobility Overal bed mobility: Needs Assistance             General bed mobility comments: up in the recliner    Transfers Overall transfer  level: Needs assistance Equipment used: Rolling walker (2 wheels), None Transfers: Sit to/from Stand, Bed to chair/wheelchair/BSC Sit to Stand: Supervision           General transfer comment: able to rise without RW but maintains UE support on armrests. therefore given RW for UE support and pt able to complete with supervision    Ambulation/Gait Ambulation/Gait assistance: Contact guard assist (chair follow) Gait Distance (Feet): 100 Feet (+ 200 ft) Assistive device: Rolling walker (2 wheels) Gait Pattern/deviations: Step-through pattern, Decreased stride length, Trunk flexed, Narrow base of support Gait velocity: decreased Gait velocity interpretation: <1.31 ft/sec, indicative of household ambulator   General Gait Details: pt with significant trunk flexion and RW in front of her desptie repeated cues. increased effort and rapid fatigue intiially without O2, improved endurance and reduced WOB with 1L O2   Stairs             Wheelchair Mobility     Tilt Bed    Modified Rankin (Stroke Patients Only)       Balance Overall balance assessment: Needs assistance Sitting-balance support: Feet supported Sitting balance-Leahy Scale: Good     Standing balance support: Reliant on assistive device for balance Standing balance-Leahy Scale: Fair Standing balance comment: able to stand with single UE support, BUE support for gait                            Communication Communication Communication: No apparent difficulties  Cognition Arousal: Alert Behavior During Therapy: WFL for tasks assessed/performed, Flat affect  PT - Cognitive impairments: Awareness                       PT - Cognition Comments: pt with limited insight to deficits and need for assistance, but able to follow commands well in session. increased cues for technique and positioning with poor adherence Following commands: Intact      Cueing Cueing Techniques: Verbal cues,  Gestural cues  Exercises      General Comments General comments (skin integrity, edema, etc.): SpO2 87-90% on Ra with exertion, >90% on 1L with gait      Pertinent Vitals/Pain Pain Assessment Pain Assessment: No/denies pain Pain Intervention(s): Limited activity within patient's tolerance, Monitored during session     PT Goals (current goals can now be found in the care plan section) Acute Rehab PT Goals Patient Stated Goal: to go home PT Goal Formulation: With patient Time For Goal Achievement: 09/08/24 Potential to Achieve Goals: Good Progress towards PT goals: Progressing toward goals    Frequency    Min 2X/week      PT Plan      Co-evaluation   Reason for Co-Treatment: Complexity of the patient's impairments (multi-system involvement);To address functional/ADL transfers PT goals addressed during session: Mobility/safety with mobility;Proper use of DME;Balance OT goals addressed during session: ADL's and self-care      AM-PAC PT 6 Clicks Mobility   Outcome Measure  Help needed turning from your back to your side while in a flat bed without using bedrails?: A Little Help needed moving from lying on your back to sitting on the side of a flat bed without using bedrails?: A Little Help needed moving to and from a bed to a chair (including a wheelchair)?: A Little Help needed standing up from a chair using your arms (e.g., wheelchair or bedside chair)?: A Little Help needed to walk in hospital room?: A Little Help needed climbing 3-5 steps with a railing? : A Lot 6 Click Score: 17    End of Session Equipment Utilized During Treatment: Gait belt;Oxygen Activity Tolerance: Patient tolerated treatment well;Patient limited by fatigue Patient left: in chair;with Mcclain bell/phone within reach Nurse Communication: Mobility status PT Visit Diagnosis: Unsteadiness on feet (R26.81);Muscle weakness (generalized) (M62.81);Difficulty in walking, not elsewhere classified  (R26.2);Other abnormalities of gait and mobility (R26.89);Pain     Time: 8763-8696 PT Time Calculation (min) (ACUTE ONLY): 27 min  Charges:    $Therapeutic Exercise: 8-22 mins PT General Charges $$ ACUTE PT VISIT: 1 Visit                     Alexis Mcclain, PT, DPT   Acute Rehabilitation Department Office [phone] Secure Chat Communication Preferred   Alexis Mcclain 09/01/2024, 2:36 PM

## 2024-09-01 NOTE — Progress Notes (Signed)
 Nutrition Follow-up  DOCUMENTATION CODES:   Not applicable  INTERVENTION:   Continue GI soft diet, family bringing in outside food for pt.  Encourage small frequent meals  D/C Ensure Plus High Protein, pt does not like, not drinking. Pt does not like Boost Plus either.   Trial Boost Breeze po TID, each supplement provides 250 kcal and 9 grams of protein Ok for family to bring in protein supplement if pt prefers   NUTRITION DIAGNOSIS:   Inadequate oral intake related to acute illness as evidenced by NPO status.  Being addressed via po  GOAL:   Patient will meet greater than or equal to 90% of their needs  Progressing  MONITOR:   PO intake, Supplement acceptance, Labs, Weight trends  REASON FOR ASSESSMENT:   Consult Enteral/tube feeding initiation and management, Assessment of nutrition requirement/status  ASSESSMENT:   74 yo female admitted with acute type B thoracic intramural hematoma-being medically managed at present. PMH includes COPD with ongoing tobacco abuse, hx of bladder cancer with tumor removal and chemo in 2019  1/20 Admitted, CTA A/P:  acute type B thoracic aortic intramural hematoma, cirrhotic changes of liver with mild steatosis, +LLL and RUL lung nodules, L renal lesion, L adrenal lesion 1/22 Repeat CT with stable type B aortic dissection 1/23 Cortrak placed, TF initiated  1/25 +Emesis, TF held-unable to place NG, Cortrak used for decompression, abd xray with ileus per MD 1/27 Trickle TF resumed 1/28 +emesis on trickle TF. CTA C/A/P with SBO with transition in the pelvis; stable Type B aortic dissection with intramural hematoma 1/29 TPN recommended, TPN consult ordered by MD but too late in the day to start same day 1/30 Cortrak out, TPN ordered to start at 30 ml/hr, unclear if pt received 1/31 No TPN Pharmacy note, no TPN order 2/01 Diet adv to GI soft  Pt tolerating po diet but not eating very much. Pt ate bites of soup brought in by family for  lunch. Pt reports she ate some pancake and sausage at breakfast but RN reports pt did not eat any breakfast.   Pt has order for Ensure but is not drinking  Limited documentation of BMs. No BM today, noted last BM documented as 2/01 but no occurrence documented that day. Pt with several small BMs on 1/31 with multiple BMs on 1/30  Labs: CBGs acceptable Sodium 136 (L) Potassium 4.3 (wdl) Phosphorus 2.8 (wdl) Magnesium  1.8 (wdl)  Meds: Dulcolax suppository  Diet Order:   Diet Order             DIET SOFT Room service appropriate? Yes; Fluid consistency: Thin  Diet effective now                   EDUCATION NEEDS:   Education needs have been addressed (family education, pt altered)  Skin:  Skin Assessment: Reviewed RN Assessment  Last BM:  1/31 multiple small/medium type 7 BMs on 1/31 and 1/30  Height:   Ht Readings from Last 1 Encounters:  08/22/24 5' 4 (1.626 m)    Weight:   Wt Readings from Last 1 Encounters:  09/01/24 64.5 kg     BMI:  Body mass index is 24.41 kg/m.  Estimated Nutritional Needs:   Kcal:  1600-1800 kcals  Protein:  75-95 g  Fluid:  >/= 1.6 L   Betsey Finger MS, RDN, LDN, CNSC Registered Dietitian 3 Clinical Nutrition RD Inpatient Contact Info in Amion

## 2024-09-01 NOTE — Telephone Encounter (Signed)
 Pharmacy Patient Advocate Encounter  Insurance verification completed.    The patient is insured through [facility]. Patient has Medicare and is not eligible for a copay card, but may be able to apply for patient assistance or Medicare RX Payment Plan (Patient Must reach out to their plan, if eligible for payment plan), if available.    Ran test claim for Anoro Ellipta  62.5-25 and the current 30 day co-pay is $4.90.   This test claim was processed through [facility]- copay amounts may vary at other pharmacies due to pharmacy/plan contracts, or as the patient moves through the different stages of their insurance plan.

## 2024-09-01 NOTE — Progress Notes (Signed)
 Inpatient Rehab Admissions Coordinator:   SBO resolved and pt tolerating a soft diet.  Note has been requiring cleviprex  for BP control.  Will see if therapy can see pt today/tomorrow in preparation to start expedited appeal with [facility] Medicare this week for prior auth.   Reche Lowers, PT, DPT Admissions Coordinator [phone] 09/01/24 10:44 AM

## 2024-09-02 ENCOUNTER — Other Ambulatory Visit (HOSPITAL_COMMUNITY): Payer: Self-pay

## 2024-09-02 DIAGNOSIS — R918 Other nonspecific abnormal finding of lung field: Secondary | ICD-10-CM | POA: Diagnosis not present

## 2024-09-02 DIAGNOSIS — I7100 Dissection of unspecified site of aorta: Secondary | ICD-10-CM | POA: Diagnosis not present

## 2024-09-02 DIAGNOSIS — K56609 Unspecified intestinal obstruction, unspecified as to partial versus complete obstruction: Secondary | ICD-10-CM | POA: Diagnosis not present

## 2024-09-02 LAB — GLUCOSE, CAPILLARY
Glucose-Capillary: 112 mg/dL — ABNORMAL HIGH (ref 70–99)
Glucose-Capillary: 95 mg/dL (ref 70–99)
Glucose-Capillary: 100 mg/dL — ABNORMAL HIGH (ref 70–99)
Glucose-Capillary: 86 mg/dL (ref 70–99)

## 2024-09-02 LAB — RENAL FUNCTION PANEL
Albumin: 3.1 g/dL — ABNORMAL LOW (ref 3.5–5.0)
Anion gap: 8 (ref 5–15)
BUN: 11 mg/dL (ref 8–23)
CO2: 29 mmol/L (ref 22–32)
Calcium: 9.1 mg/dL (ref 8.9–10.3)
Chloride: 102 mmol/L (ref 98–111)
Creatinine, Ser: 0.6 mg/dL (ref 0.44–1.00)
GFR, Estimated: >60 mL/min
Glucose, Bld: 98 mg/dL (ref 70–99)
Phosphorus: 2.7 mg/dL (ref 2.5–4.6)
Potassium: 3.6 mmol/L (ref 3.5–5.1)
Sodium: 138 mmol/L (ref 135–145)

## 2024-09-02 MED ORDER — NICOTINE 21 MG/24HR TD PT24: 21.0000 mg | MEDICATED_PATCH | Freq: Every day | TRANSDERMAL | 0 refills | Status: DC

## 2024-09-02 MED ORDER — HEPARIN SOD (PORK) LOCK FLUSH 100 UNIT/ML IV SOLN
500.0000 [IU] | INTRAVENOUS | Status: AC | PRN
  Administered 2024-09-02: 500 [IU]

## 2024-09-02 MED ORDER — UMECLIDINIUM-VILANTEROL 62.5-25 MCG/ACT IN AEPB: 1.0000 | INHALATION_SPRAY | Freq: Every day | RESPIRATORY_TRACT | 0 refills | Status: DC

## 2024-09-02 MED ORDER — METOPROLOL TARTRATE 100 MG PO TABS
100.0000 mg | ORAL_TABLET | Freq: Two times a day (BID) | ORAL | 0 refills | Status: AC
  Filled 2024-09-02: qty 60, 30d supply, fill #0

## 2024-09-02 MED ORDER — LOSARTAN POTASSIUM 50 MG PO TABS
50.0000 mg | ORAL_TABLET | Freq: Every day | ORAL | 0 refills | Status: AC
  Filled 2024-09-02: qty 30, 30d supply, fill #0

## 2024-09-02 MED ORDER — NICOTINE 21 MG/24HR TD PT24
21.0000 mg | MEDICATED_PATCH | Freq: Every day | TRANSDERMAL | 0 refills | Status: AC
  Filled 2024-09-02: qty 28, 28d supply, fill #0

## 2024-09-02 MED ORDER — LOSARTAN POTASSIUM 50 MG PO TABS: 50.0000 mg | ORAL_TABLET | Freq: Every day | ORAL | 0 refills | Status: DC

## 2024-09-02 MED ORDER — UMECLIDINIUM-VILANTEROL 62.5-25 MCG/ACT IN AEPB
1.0000 | INHALATION_SPRAY | Freq: Every day | RESPIRATORY_TRACT | 0 refills | Status: AC
  Filled 2024-09-02: qty 60, 30d supply, fill #0

## 2024-09-02 MED ORDER — METOPROLOL TARTRATE 100 MG PO TABS: 100.0000 mg | ORAL_TABLET | Freq: Two times a day (BID) | ORAL | 0 refills | Status: DC

## 2024-09-02 NOTE — TOC Transition Note (Signed)
 Transition of Care [facility]) - Discharge Note   Patient Details  Name: Alexis Mcclain MRN: [id number] Date of Birth: 01/03/1951  [facility] CM/SW Contact:  Sudie Erminio Deems, RN Phone Number: 09/02/2024, 2:13 PM   Clinical Narrative: ICM spoke with patient regarding PT/OT recommendations for home health services. Patient has declined services. Staff RN and MD notified that the patient declined services. ICM discussed DME and the patient also declined Rolling walker, BSC, and the tub seat. Daughter to assist with transportation home via private vehicle. No further needs identified at this time.    Final next level of care: Home/Self Care Barriers to Discharge: No Barriers Identified   Patient Goals and CMS Choice Patient states their goals for this hospitalization and ongoing recovery are:: plan to return home  Discharge Plan and Services Additional resources added to the After Visit Summary for   In-house Referral: NA Discharge Planning Services: CM Consult Post Acute Care Choice: NA            DME Agency: NA       HH Arranged: NA   Social Drivers of Health (SDOH) Interventions SDOH Screenings   Food Insecurity: No Food Insecurity (08/19/2024)  Housing: Low Risk (08/19/2024)  Transportation Needs: No Transportation Needs (08/19/2024)  Utilities: Not At Risk (08/19/2024)  Social Connections: Unknown (08/25/2024)  Tobacco Use: High Risk (08/28/2024)     Readmission Risk Interventions     No data to display

## 2024-09-02 NOTE — Hospital Course (Addendum)
 SABRA

## 2024-09-02 NOTE — Plan of Care (Signed)

## 2024-09-03 ENCOUNTER — Other Ambulatory Visit: Payer: Self-pay

## 2024-09-03 DIAGNOSIS — I7100 Dissection of unspecified site of aorta: Secondary | ICD-10-CM

## 2024-09-26 ENCOUNTER — Ambulatory Visit

## 2024-09-29 ENCOUNTER — Ambulatory Visit (HOSPITAL_COMMUNITY)

## 2024-10-15 ENCOUNTER — Ambulatory Visit: Admitting: Vascular Surgery
